# Patient Record
Sex: Female | Born: 1975 | Race: Asian | Hispanic: No | Marital: Married | Smoking: Never smoker
Health system: Southern US, Community
[De-identification: ages and names within clinical notes are randomized; demographics above are authoritative.]

## PROBLEM LIST (undated history)

## (undated) DIAGNOSIS — N72 Inflammatory disease of cervix uteri: Secondary | ICD-10-CM

## (undated) DIAGNOSIS — J45909 Unspecified asthma, uncomplicated: Secondary | ICD-10-CM

## (undated) HISTORY — DX: Inflammatory disease of cervix uteri: N72

## (undated) HISTORY — PX: BREAST BIOPSY: SHX20

## (undated) HISTORY — PX: APPENDECTOMY: SHX54

---

## 2017-01-03 ENCOUNTER — Other Ambulatory Visit: Payer: Self-pay | Admitting: Nurse Practitioner

## 2017-01-03 DIAGNOSIS — R1010 Upper abdominal pain, unspecified: Secondary | ICD-10-CM

## 2017-01-09 ENCOUNTER — Ambulatory Visit
Admission: RE | Admit: 2017-01-09 | Discharge: 2017-01-09 | Disposition: A | Discharge status: Home / Self Care | Payer: BLUE CROSS/BLUE SHIELD | Source: Ambulatory Visit | Attending: Nurse Practitioner | Admitting: Nurse Practitioner

## 2017-01-09 DIAGNOSIS — R1010 Upper abdominal pain, unspecified: Secondary | ICD-10-CM | POA: Insufficient documentation

## 2017-04-17 ENCOUNTER — Other Ambulatory Visit: Payer: Self-pay | Admitting: Nurse Practitioner

## 2017-04-17 DIAGNOSIS — Z1231 Encounter for screening mammogram for malignant neoplasm of breast: Secondary | ICD-10-CM

## 2017-05-21 ENCOUNTER — Ambulatory Visit
Admission: RE | Admit: 2017-05-21 | Discharge: 2017-05-21 | Disposition: A | Discharge status: Home / Self Care | Payer: BLUE CROSS/BLUE SHIELD | Source: Ambulatory Visit | Attending: Nurse Practitioner | Admitting: Nurse Practitioner

## 2017-05-21 ENCOUNTER — Encounter (HOSPITAL_COMMUNITY): Payer: Self-pay

## 2017-05-21 DIAGNOSIS — Z1231 Encounter for screening mammogram for malignant neoplasm of breast: Secondary | ICD-10-CM | POA: Diagnosis not present

## 2017-06-14 ENCOUNTER — Telehealth: Payer: Self-pay | Admitting: Obstetrics & Gynecology

## 2017-06-14 NOTE — Telephone Encounter (Signed)
[facility] referring for Birth control option, possibly IUD. lvm for pt to cb

## 2017-06-18 NOTE — Telephone Encounter (Signed)
Lvm for patient to call back to be schedule °

## 2017-07-03 ENCOUNTER — Ambulatory Visit (INDEPENDENT_AMBULATORY_CARE_PROVIDER_SITE_OTHER): Payer: BLUE CROSS/BLUE SHIELD | Admitting: Obstetrics and Gynecology

## 2017-07-03 ENCOUNTER — Encounter: Payer: Self-pay | Admitting: Obstetrics and Gynecology

## 2017-07-03 VITALS — BP 110/78 | HR 95 | Ht 64.0 in | Wt 145.0 lb

## 2017-07-03 DIAGNOSIS — Z124 Encounter for screening for malignant neoplasm of cervix: Secondary | ICD-10-CM

## 2017-07-03 DIAGNOSIS — L811 Chloasma: Secondary | ICD-10-CM

## 2017-07-03 DIAGNOSIS — Z30014 Encounter for initial prescription of intrauterine contraceptive device: Secondary | ICD-10-CM

## 2017-07-03 DIAGNOSIS — N72 Inflammatory disease of cervix uteri: Secondary | ICD-10-CM | POA: Diagnosis not present

## 2017-07-03 DIAGNOSIS — Z1151 Encounter for screening for human papillomavirus (HPV): Secondary | ICD-10-CM

## 2017-07-03 DIAGNOSIS — Z01419 Encounter for gynecological examination (general) (routine) without abnormal findings: Secondary | ICD-10-CM

## 2017-07-03 NOTE — Progress Notes (Signed)
PCP:  Fayrene Helper, NP   Chief Complaint  Patient presents with  . Gynecologic Exam  . Contraception    referred by PCP for IUD   ENCOUNTER DONE WITH [place] INTERPRETER ON LANGUAGE LINE--"Kristin Shepherd", # [id number].   HPI:      Ms. Kristin Shepherd is a 42 y.o. [phone] who LMP was No LMP recorded. Patient is not currently having periods (Reason: Perimenopausal)., presents today for her NP annual examination.  Her menses are regular every 28-30 days, lasting 7 days.  Dysmenorrhea mild, occurring first 1-2 days of flow. She does not have intermenstrual bleeding.  Sex activity: single partner, contraception - OCP (estrogen/progesterone) "Mercilon". Pt got it in [place], but it's a low dose desogestrel/EE pill. Pt having issues with melasma on cheeks and chin and wants IUD again. Interested in [place].  Last Pap: ~6 yrs ago. 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 Was told to have cx check every 6 months. Had IUD removed a couple yrs ago due to this. Been on OCPs since.  Last mammogram: May 21, 2017  Results were: normal--routine follow-up in 12 months, with PCP. There is no FH of breast cancer. There is no FH of ovarian cancer.    Past Medical History:  Diagnosis Date  . Cervical inflammation     Past Surgical History:  Procedure Laterality Date  . BREAST BIOPSY Left    neg    Family History  Problem Relation Age of Onset  . Breast cancer Neg Hx     Social History   Socioeconomic History  . Marital status: Married    Spouse name: Not on file  . Number of children: Not on file  . Years of education: Not on file  . Highest education level: Not on file  Social Needs  . Financial resource strain: Not on file  . Food insecurity - worry: Not on file  . Food insecurity - inability: Not on file  . Transportation needs - medical: Not on file  . Transportation needs - non-medical: Not on file  Occupational History  . Not on file  Tobacco Use   . Smoking status: Never Smoker  . Smokeless tobacco: Never Used  Substance and Sexual Activity  . Alcohol use: No    Frequency: Never  . Drug use: No  . Sexual activity: Yes    Birth control/protection: Pill  Other Topics Concern  . Not on file  Social History Narrative  . Not on file    Current Meds  Medication Sig  . ADVAIR DISKUS 250-50 MCG/DOSE AEPB   . azithromycin (ZITHROMAX) 500 MG tablet take 1 tablet by mouth once daily for 7 days with food  . desogestrel-ethinyl estradiol (KARIVA,AZURETTE,MIRCETTE) 0.15-0.02/0.01 MG (21/5) tablet Take 1 tablet by mouth daily.  . fluticasone (FLONASE) 50 MCG/ACT nasal spray instill 1 spray into each nostril daily as directed for 2 weeks  . montelukast (SINGULAIR) 10 MG tablet Take 10 mg by mouth every morning.  Marland Kitchen omeprazole (PRILOSEC) 40 MG capsule take 1 capsule by mouth every morning 1 HOUR before breakfast for ACID REFLUX  . predniSONE (DELTASONE) 20 MG tablet take 2 tablets by mouth every morning with food for 5 days  . RA ALLERGY RELIEF 180 MG tablet take 1 tablet by mouth every morning for POST NASAL DRIP for 2 weeks     ROS:  Review of Systems  Constitutional: Negative for fatigue, fever and unexpected weight change.  Respiratory: Negative  for cough, shortness of breath and wheezing.   Cardiovascular: Negative for chest pain, palpitations and leg swelling.  Gastrointestinal: Negative for blood in stool, constipation, diarrhea, nausea and vomiting.  Endocrine: Negative for cold intolerance, heat intolerance and polyuria.  Genitourinary: Negative for dyspareunia, dysuria, flank pain, frequency, genital sores, hematuria, menstrual problem, pelvic pain, urgency, vaginal bleeding, vaginal discharge and vaginal pain.  Musculoskeletal: Negative for back pain, joint swelling and myalgias.  Skin: Negative for rash.  Neurological: Negative for dizziness, syncope, light-headedness, numbness and headaches.  Hematological: Negative for  adenopathy.  Psychiatric/Behavioral: Negative for agitation, confusion, sleep disturbance and suicidal ideas. The patient is not nervous/anxious.      Objective: BP 110/78   Pulse 95    Physical Exam  Constitutional: She is oriented to person, place, and time. She appears well-developed and well-nourished.  Genitourinary: Vagina normal and uterus normal. There is no rash or tenderness on the right labia. There is no rash or tenderness on the left labia. No erythema or tenderness in the vagina. No vaginal discharge found. Right adnexum does not display mass and does not display tenderness. Left adnexum does not display mass and does not display tenderness. Cervix does not exhibit motion tenderness or polyp. Uterus is not enlarged or tender.  Neck: Normal range of motion. No thyromegaly present.  Cardiovascular: Normal rate, regular rhythm and normal heart sounds.  No murmur heard. Pulmonary/Chest: Effort normal and breath sounds normal. Right breast exhibits no mass, no nipple discharge, no skin change and no tenderness. Left breast exhibits no mass, no nipple discharge, no skin change and no tenderness.  Abdominal: Soft. There is no tenderness. There is no guarding.  Musculoskeletal: Normal range of motion.  Neurological: She is alert and oriented to person, place, and time. No cranial nerve deficit.  Skin:  BROWN MELASMA PATCHES ON CHEEKS AND CHIN  Psychiatric: She has a normal mood and affect. Her behavior is normal.  Vitals reviewed.   Assessment/Plan: Encounter for annual routine gynecological examination  Cervical cancer screening - Plan: IGP, Aptima HPV  Screening for HPV (human papillomavirus) - Plan: IGP, Aptima HPV  Cervical inflammation - Check pap smear. Will call with results.   Encounter for initial prescription of intrauterine contraceptive device (IUD) - IUD discussed. Mirena handout given. Pt to RTO wtih menses for insertion. NSAIDs. Either bring niece as  interpreter or can do language line.   Melasma - Should resolve once off OCPs.    GYN counsel use and side effects of OCP's, family planning choices     F/U  Return in about 1 week (around 07/10/2017) for with menses for IUD insertion.  Alicia B. Copland, PA-C 07/03/2017 4:05 PM

## 2017-07-03 NOTE — Patient Instructions (Signed)
I value your feedback and entrusting us with your care. If you get a [facility] patient survey, I would appreciate you taking the time to let us know about your experience today. Thank you! 

## 2017-07-05 LAB — IGP, APTIMA HPV
HPV Aptima: NEGATIVE
PAP SMEAR COMMENT: 0

## 2017-07-15 ENCOUNTER — Telehealth: Payer: Self-pay | Admitting: Obstetrics and Gynecology

## 2017-07-15 NOTE — Telephone Encounter (Signed)
Pt is schedule 07/16/17 for Mirena insertion. Pt requested interupter

## 2017-07-16 ENCOUNTER — Encounter: Payer: Self-pay | Admitting: Obstetrics and Gynecology

## 2017-07-16 ENCOUNTER — Ambulatory Visit: Payer: BLUE CROSS/BLUE SHIELD | Admitting: Obstetrics and Gynecology

## 2017-07-16 VITALS — BP 118/78 | Ht 64.0 in | Wt 147.0 lb

## 2017-07-16 DIAGNOSIS — Z30430 Encounter for insertion of intrauterine contraceptive device: Secondary | ICD-10-CM

## 2017-07-16 MED ORDER — LEVONORGESTREL 20 MCG/24HR IU IUD: 1.0000 | INTRAUTERINE_SYSTEM | Freq: Once | INTRAUTERINE | 0 refills | Status: DC

## 2017-07-16 NOTE — Progress Notes (Signed)
   Chief Complaint  Patient presents with  . Contraception     IUD PROCEDURE NOTE:  Kristin Shepherd is a 42 y.o. [id number] here for Mirena  IUD insertion for contraception. She had melasma with OCPs. Referred by PCP for IUD insertion.  Pap 2/19 neg/Neg HPV DNA.   BP 118/78   Ht 5\' 4"  (1.626 m)   Wt 147 lb (66.7 kg)   BMI 25.23 kg/m   IUD Insertion Procedure Note Patient identified, informed consent performed, consent signed.   Discussed risks of irregular bleeding, cramping, infection, malpositioning or misplacement of the IUD outside the uterus which may require further procedure such as laparoscopy, risk of failure <1%. Time out was performed.    Speculum placed in the vagina.  Cervix visualized.  Cleaned with Betadine x 2.  Grasped anteriorly with a single tooth tenaculum.  Uterus sounded to 7.0 cm.   IUD placed per manufacturer's recommendations.  Strings trimmed to 3 cm. Tenaculum was removed, good hemostasis noted.  Patient tolerated procedure well.   ASSESSMENT:  Encounter for insertion of intrauterine contraceptive device (IUD) - Plan: levonorgestrel (MIRENA, 52 MG,) 20 MCG/24HR IUD   Meds ordered this encounter  Medications  . levonorgestrel (MIRENA, 52 MG,) 20 MCG/24HR IUD    Sig: 1 Intra Uterine Device (1 each total) by Intrauterine route once for 1 dose.    Dispense:  1 Intra Uterine Device    Refill:  0    Order Specific Question:   Supervising Provider    Answer:   Nadara MustardHARRIS, ROBERT P [[id number]]   Plan:  Patient was given post-procedure instructions.  She was advised to have backup contraception for one week.   Call if you are having increasing pain, cramps or bleeding or if you have a fever greater than 100.4 degrees F., shaking chills, nausea or vomiting. Patient was also asked to check IUD strings periodically and follow up in 4 weeks for IUD check.  Return in about 4 weeks (around 08/13/2017) for IUD f/u.  Atticus Lemberger B. Armeda Plumb, PA-C 07/16/2017 2:34 PM

## 2017-07-16 NOTE — Patient Instructions (Addendum)
I value your feedback and entrusting us with your care. If you get a [facility] patient survey, I would appreciate you taking the time to let us know about your experience today. Thank you!  [facility] [phone]  Instructions after IUD insertion  Most women experience no significant problems after insertion of an IUD, however minor cramping and spotting for a few days is common. Cramps may be treated with ibuprofen 800mg every 8 hours or Tylenol 650 mg every 4 hours. Contact [facility] immediately if you experience any of the following symptoms during the next week: temperature >99.6 degrees, worsening pelvic pain, abdominal pain, fainting, unusually heavy vaginal bleeding, foul vaginal discharge, or if you think you have expelled the IUD.  Nothing inserted in the vagina for 48 hours. You will be scheduled for a follow up visit in approximately four weeks.  You should check monthly to be sure you can feel the IUD strings in the upper vagina. If you are having a monthly period, try to check after each period. If you cannot feel the IUD strings,  contact [facility] immediately so we can do an exam to determine if the IUD has been expelled.   Please use backup protection until we can confirm the IUD is in place.  Call [facility] if you are exposed to or diagnosed with a sexually transmitted infection, as we will need to discuss whether it is safe for you to continue using an IUD.   

## 2017-07-17 NOTE — Telephone Encounter (Signed)
Mirena received 07/16/17

## 2017-08-14 ENCOUNTER — Ambulatory Visit: Payer: BLUE CROSS/BLUE SHIELD | Admitting: Obstetrics and Gynecology

## 2017-08-14 ENCOUNTER — Encounter: Payer: Self-pay | Admitting: Obstetrics and Gynecology

## 2017-08-14 VITALS — BP 110/68 | HR 75 | Ht 62.0 in | Wt 148.0 lb

## 2017-08-14 DIAGNOSIS — Z30431 Encounter for routine checking of intrauterine contraceptive device: Secondary | ICD-10-CM | POA: Diagnosis not present

## 2017-08-14 NOTE — Progress Notes (Signed)
   Chief Complaint  Patient presents with  . Follow-up    IUD check      History of Present Illness:  Kristin Shepherd is a 42 y.o. that had a Mirena IUD placed approximately 1 month ago. Since that time, she denies dyspareunia, pelvic pain, non-menstrual bleeding, vaginal d/c, heavy bleeding. Melasma is improving, which is one reason pt wanted off OCPs. Doing well.   Review of Systems  Constitutional: Negative for fever.  Gastrointestinal: Negative for blood in stool, constipation, diarrhea, nausea and vomiting.  Genitourinary: Negative for dyspareunia, dysuria, flank pain, frequency, hematuria, urgency, vaginal bleeding, vaginal discharge and vaginal pain.  Musculoskeletal: Negative for back pain.  Skin: Negative for rash.    Physical Exam:  BP 110/68   Pulse 75   Ht 5\' 2"  (1.575 m)   Wt 148 lb (67.1 kg)   BMI 27.07 kg/m  Body mass index is 27.07 kg/m.  Pelvic exam:  Two IUD strings present seen coming from the cervical os. EGBUS, vaginal vault and cervix: within normal limits   Assessment:   Encounter for routine checking of intrauterine contraceptive device (IUD)  IUD strings present in proper location; pt doing well  Plan: F/u if any signs of infection or can no longer feel the strings.   Kristin Doten B. Jewell Ryans, PA-C 08/14/2017 10:03 AM

## 2017-08-14 NOTE — Patient Instructions (Signed)
I value your feedback and entrusting us with your care. If you get a [facility] patient survey, I would appreciate you taking the time to let us know about your experience today. Thank you! 

## 2018-01-22 ENCOUNTER — Other Ambulatory Visit: Payer: Self-pay | Admitting: Nurse Practitioner

## 2018-01-22 DIAGNOSIS — Z1231 Encounter for screening mammogram for malignant neoplasm of breast: Secondary | ICD-10-CM

## 2018-05-23 ENCOUNTER — Ambulatory Visit
Admission: RE | Admit: 2018-05-23 | Discharge: 2018-05-23 | Disposition: A | Discharge status: Home / Self Care | Payer: BLUE CROSS/BLUE SHIELD | Source: Ambulatory Visit | Attending: Nurse Practitioner | Admitting: Nurse Practitioner

## 2018-05-23 DIAGNOSIS — Z1231 Encounter for screening mammogram for malignant neoplasm of breast: Secondary | ICD-10-CM | POA: Diagnosis present

## 2018-05-28 ENCOUNTER — Other Ambulatory Visit: Payer: Self-pay | Admitting: Nurse Practitioner

## 2018-05-28 DIAGNOSIS — R928 Other abnormal and inconclusive findings on diagnostic imaging of breast: Secondary | ICD-10-CM

## 2018-05-28 DIAGNOSIS — N6489 Other specified disorders of breast: Secondary | ICD-10-CM

## 2019-03-11 ENCOUNTER — Other Ambulatory Visit: Payer: Self-pay | Admitting: Nurse Practitioner

## 2019-03-11 DIAGNOSIS — N6489 Other specified disorders of breast: Secondary | ICD-10-CM

## 2019-05-22 ENCOUNTER — Encounter: Payer: Self-pay | Admitting: Emergency Medicine

## 2019-05-22 ENCOUNTER — Emergency Department: Payer: 59

## 2019-05-22 ENCOUNTER — Emergency Department
Admission: EM | Admit: 2019-05-22 | Discharge: 2019-05-22 | Disposition: A | Discharge status: Home / Self Care | Payer: 59 | Attending: Student | Admitting: Student

## 2019-05-22 ENCOUNTER — Other Ambulatory Visit: Payer: Self-pay

## 2019-05-22 DIAGNOSIS — J45909 Unspecified asthma, uncomplicated: Secondary | ICD-10-CM | POA: Diagnosis not present

## 2019-05-22 DIAGNOSIS — R0789 Other chest pain: Secondary | ICD-10-CM | POA: Diagnosis not present

## 2019-05-22 DIAGNOSIS — Z79899 Other long term (current) drug therapy: Secondary | ICD-10-CM | POA: Diagnosis not present

## 2019-05-22 DIAGNOSIS — R109 Unspecified abdominal pain: Secondary | ICD-10-CM | POA: Diagnosis not present

## 2019-05-22 HISTORY — DX: Unspecified asthma, uncomplicated: J45.909

## 2019-05-22 LAB — COMPREHENSIVE METABOLIC PANEL
ALT: 26 U/L (ref 0–44)
AST: 25 U/L (ref 15–41)
Albumin: 4.2 g/dL (ref 3.5–5.0)
Alkaline Phosphatase: 68 U/L (ref 38–126)
Anion gap: 9 (ref 5–15)
BUN: 15 mg/dL (ref 6–20)
CO2: 23 mmol/L (ref 22–32)
Calcium: 9.3 mg/dL (ref 8.9–10.3)
Chloride: 108 mmol/L (ref 98–111)
Creatinine, Ser: 0.66 mg/dL (ref 0.44–1.00)
GFR calc Af Amer: >60 mL/min (ref >60)
GFR calc non Af Amer: >60 mL/min (ref >60)
Glucose, Bld: 135 mg/dL — ABNORMAL HIGH (ref 70–99)
Potassium: 3.6 mmol/L (ref 3.5–5.1)
Sodium: 140 mmol/L (ref 135–145)
Total Bilirubin: 0.6 mg/dL (ref 0.3–1.2)
Total Protein: 7.5 g/dL (ref 6.5–8.1)

## 2019-05-22 LAB — CBC WITH DIFFERENTIAL/PLATELET
Abs Immature Granulocytes: 0.03 10*3/uL (ref 0.00–0.07)
Basophils Absolute: 0 10*3/uL (ref 0.0–0.1)
Basophils Relative: 0 %
Eosinophils Absolute: 0.1 10*3/uL (ref 0.0–0.5)
Eosinophils Relative: 1 %
HCT: 37.9 % (ref 36.0–46.0)
Hemoglobin: 13.3 g/dL (ref 12.0–15.0)
Immature Granulocytes: 0 %
Lymphocytes Relative: 14 %
Lymphs Abs: 1.2 10*3/uL (ref 0.7–4.0)
MCH: 29.7 pg (ref 26.0–34.0)
MCHC: 35.1 g/dL (ref 30.0–36.0)
MCV: 84.6 fL (ref 80.0–100.0)
Monocytes Absolute: 0.7 10*3/uL (ref 0.1–1.0)
Monocytes Relative: 8 %
Neutro Abs: 7 10*3/uL (ref 1.7–7.7)
Neutrophils Relative %: 77 %
Platelets: 254 10*3/uL (ref 150–400)
RBC: 4.48 MIL/uL (ref 3.87–5.11)
RDW: 12.4 % (ref 11.5–15.5)
WBC: 9.1 10*3/uL (ref 4.0–10.5)
nRBC: 0 % (ref 0.0–0.2)

## 2019-05-22 LAB — TROPONIN I (HIGH SENSITIVITY)
Troponin I (High Sensitivity): <2 ng/L (ref <18)
Troponin I (High Sensitivity): <2 ng/L (ref <18)

## 2019-05-22 LAB — LIPASE, BLOOD: Lipase: 43 U/L (ref 11–51)

## 2019-05-22 MED ORDER — DICYCLOMINE HCL 10 MG PO CAPS: 10.0000 mg | ORAL_CAPSULE | Freq: Three times a day (TID) | ORAL | 0 refills | Status: DC | PRN

## 2019-05-22 MED ORDER — DICYCLOMINE HCL 10 MG PO CAPS
10.0000 mg | ORAL_CAPSULE | Freq: Once | ORAL | Status: AC
  Administered 2019-05-22: 10:00:00 10 mg via ORAL
  Filled 2019-05-22: qty 1

## 2019-05-22 MED ORDER — ASPIRIN 81 MG PO CHEW
324.0000 mg | CHEWABLE_TABLET | Freq: Once | ORAL | Status: AC
  Administered 2019-05-22: 324 mg via ORAL
  Filled 2019-05-22: qty 4

## 2019-05-22 NOTE — ED Provider Notes (Signed)
[facility] Emergency Department Provider Note  ____________________________________________   First MD Initiated Contact with Patient 05/22/19 0912     (approximate)  I have reviewed the triage vital signs and the nursing notes.  History  Chief Complaint Abdominal Pain    HPI Kristin Shepherd is a 44 y.o. female with history of asthma, who presents emergency department for multiple complaints.  First, she states she woke up this morning around 2 AM and had diffuse abdominal pain.  Did not localize to one specific spot.  Described as a cramping-like sensation, moderate in severity, no radiation.  She had a loose bowel movement and her pain was alleviated.  During her period of discomfort, she did have sweating and some shortness of breath.  These have improved since moving her bowels.  She is not currently feeling any abdominal pain, but states she has recurrence of symptoms when she feels urge to have a bowel movement.  Second, she states that since waiting in the emergency department she developed some vague, left-sided chest pain.  Difficult to describe.  No radiation.  No alleviating or aggravating factors.  No associated nausea.  Her shortness of breath has improved since having her bowel movement, as noted above.  Does not smoke tobacco.  No history of heart disease.  Recently diagnosed with COVID 3 weeks ago, but recovered at home without difficulty.  Did not require hospitalization.  History obtained with the assistance of a [place] interpreter.   Past Medical Hx Past Medical History:  Diagnosis Date  . Asthma   . Cervical inflammation     Problem List Patient Active Problem List   Diagnosis Date Noted  . Cervical inflammation 07/03/2017  . Melasma 07/03/2017    Past Surgical Hx Past Surgical History:  Procedure Laterality Date  . APPENDECTOMY    . BREAST BIOPSY Left    neg    Medications Prior to Admission medications   Medication Sig  Start Date End Date Taking? Authorizing Provider  ADVAIR DISKUS 250-50 MCG/DOSE AEPB  06/24/17   [provider]  azithromycin (ZITHROMAX) 500 MG tablet take 1 tablet by mouth once daily for 7 days with food 06/03/17   [provider]  fluticasone (FLONASE) 50 MCG/ACT nasal spray instill 1 spray into each nostril daily as directed for 2 weeks 06/03/17   [provider]  levonorgestrel (MIRENA, 52 MG,) 20 MCG/24HR IUD 1 Intra Uterine Device (1 each total) by Intrauterine route once for 1 dose. 07/16/17 07/16/17  Copland, Ilona Sorrel, PA-C  montelukast (SINGULAIR) 10 MG tablet Take 10 mg by mouth every morning. 04/17/17   [provider]  omeprazole (PRILOSEC) 40 MG capsule take 1 capsule by mouth every morning 1 HOUR before breakfast for ACID REFLUX 06/03/17   [provider]  predniSONE (DELTASONE) 20 MG tablet take 2 tablets by mouth every morning with food for 5 days 06/03/17   [provider]  RA ALLERGY RELIEF 180 MG tablet take 1 tablet by mouth every morning for POST NASAL DRIP for 2 weeks 06/03/17   [provider]    Allergies Patient has no known allergies.  Family Hx Family History  Problem Relation Age of Onset  . Breast cancer Neg Hx     Social Hx Social History   Tobacco Use  . Smoking status: Never Smoker  . Smokeless tobacco: Never Used  Substance Use Topics  . Alcohol use: No  . Drug use: No     Review of  Systems  Constitutional: Negative for fever, chills. Eyes: Negative for visual changes. ENT: Negative for sore throat. Cardiovascular: + for chest pain. Respiratory: + for shortness of breath. Gastrointestinal: + abdominal pain Genitourinary: Negative for dysuria. Musculoskeletal: Negative for leg swelling. Skin: Negative for rash. Neurological: Negative for for headaches.   Physical Exam  Vital Signs: ED Triage Vitals  Enc Vitals Group     BP 05/22/19 0458 126/81     Pulse Rate 05/22/19 0458 92       Resp 05/22/19 0458 20     Temp 05/22/19 0458 98 F (36.7 C)     Temp Source 05/22/19 0458 Oral     SpO2 05/22/19 0458 98 %     Weight 05/22/19 0458 141 lb 1.5 oz (64 kg)     Height 05/22/19 0458 5\' 4"  (1.626 m)     Head Circumference --      Peak Flow --      Pain Score 05/22/19 0456 0     Pain Loc --      Pain Edu? --      Excl. in [place]? --     Constitutional: Alert and oriented.  Head: Normocephalic. Atraumatic. Eyes: Conjunctivae clear. Sclera anicteric. Nose: No congestion. No rhinorrhea. Mouth/Throat: Wearing mask.  Neck: No stridor.   Cardiovascular: Normal rate, regular rhythm. Extremities well perfused. Respiratory: Normal respiratory effort.  Lungs CTAB. Gastrointestinal: Soft. Non-tender throughout to deep palpation. Non-distended.  Musculoskeletal: No lower extremity edema. No deformities. Neurologic:  Normal speech and language. No gross focal neurologic deficits are appreciated.  Skin: Skin is warm, dry and intact. No rash noted. Psychiatric: Mood and affect are appropriate for situation.  EKG  Personally reviewed.   Rate: 79 Rhythm: sinus Axis: normal Intervals: WNL No acute ischemic changes No STEMI    Radiology  CXR: IMPRESSION:  No active disease.    Procedures  Procedure(s) performed (including critical care):  Procedures   Initial Impression / Assessment and Plan / ED Course  44 y.o. female who presents to the ED for abdominal cramping, and a separate episode of chest pain while waiting in the emergency department.  As above.  DDx: abdominal cramping, viral GI process, colitis, IBS. Abdominal exam is very reassuring, abdomen is soft, completely nontender throughout.  She denies any pain at present.  CMP, lipase without actionable derangements, do not feel emergent imaging is indicated at this time.  Will give Bentyl for symptomatic support.  DDx: atypical ACS, post viral pleurisy/pericarditis, GERD. PERC negative, doubt PE - no  tachycardia, tachypnea, or hypoxia. Will obtain troponin x 2, EKG CXR.  ASA.  Troponin x 2 negative. EKG without acute ischemic changes.  XR negative.  Remainder of labs without actionable derangements.  Given negative work-up, patient stable for discharge with outpatient follow-up.  Given Rx for Bentyl as needed for symptomatic support.  Given return precautions.   Final Clinical Impression(s) / ED Diagnosis  Final diagnoses:  Abdominal cramping  Chest discomfort       Note:  This document was prepared using Dragon voice recognition software and may include unintentional dictation errors.   Lilia Pro., MD 05/22/19 1100

## 2019-05-22 NOTE — ED Notes (Signed)
[employer] interpretor used [id number].  [place]

## 2019-05-22 NOTE — Discharge Instructions (Signed)
Thank you for letting us take care of you in the emergency department today.   Please continue to take any regular, prescribed medications.   New medications we have prescribed:  - Bentyl - take as needed for abdominal cramping.  Please follow up with: - Your primary care doctor to review your ER visit and follow up on your symptoms.   Please return to the ER for any new or worsening symptoms.

## 2019-05-22 NOTE — ED Triage Notes (Signed)
Pt to ED lobby via w/c with no distress noted, mask in place; per [employer] video interpreter, pt reports having mid lower abd pain that increased after diarrheal stool; denies N/V; denies pain now; denies constipation or urinary c/o

## 2021-01-04 ENCOUNTER — Other Ambulatory Visit: Payer: Self-pay | Admitting: Nurse Practitioner

## 2021-01-04 DIAGNOSIS — N6489 Other specified disorders of breast: Secondary | ICD-10-CM

## 2021-01-20 ENCOUNTER — Other Ambulatory Visit: Payer: Self-pay

## 2021-01-20 ENCOUNTER — Ambulatory Visit
Admission: RE | Admit: 2021-01-20 | Discharge: 2021-01-20 | Disposition: A | Discharge status: Home / Self Care | Payer: 59 | Source: Ambulatory Visit | Attending: Nurse Practitioner | Admitting: Nurse Practitioner

## 2021-01-20 DIAGNOSIS — N6489 Other specified disorders of breast: Secondary | ICD-10-CM | POA: Insufficient documentation

## 2021-01-24 ENCOUNTER — Other Ambulatory Visit: Payer: Self-pay | Admitting: Nurse Practitioner

## 2021-01-24 DIAGNOSIS — N6489 Other specified disorders of breast: Secondary | ICD-10-CM

## 2022-04-10 ENCOUNTER — Other Ambulatory Visit (HOSPITAL_COMMUNITY)
Admission: RE | Admit: 2022-04-10 | Discharge: 2022-04-10 | Disposition: A | Discharge status: Home / Self Care | Payer: Commercial Managed Care - HMO | Source: Ambulatory Visit | Attending: Advanced Practice Midwife | Admitting: Advanced Practice Midwife

## 2022-04-10 ENCOUNTER — Encounter: Payer: Self-pay | Admitting: Advanced Practice Midwife

## 2022-04-10 ENCOUNTER — Ambulatory Visit (INDEPENDENT_AMBULATORY_CARE_PROVIDER_SITE_OTHER): Payer: Commercial Managed Care - HMO | Admitting: Advanced Practice Midwife

## 2022-04-10 VITALS — BP 140/86 | HR 88 | Ht 64.0 in | Wt 158.0 lb

## 2022-04-10 DIAGNOSIS — Z30011 Encounter for initial prescription of contraceptive pills: Secondary | ICD-10-CM | POA: Diagnosis not present

## 2022-04-10 DIAGNOSIS — Z124 Encounter for screening for malignant neoplasm of cervix: Secondary | ICD-10-CM

## 2022-04-10 DIAGNOSIS — Z30432 Encounter for removal of intrauterine contraceptive device: Secondary | ICD-10-CM | POA: Diagnosis not present

## 2022-04-10 MED ORDER — DESOGESTREL-ETHINYL ESTRADIOL 0.15-0.02/0.01 MG (21/5) PO TABS: 1.0000 | ORAL_TABLET | Freq: Every day | ORAL | 11 refills | Status: DC

## 2022-04-11 ENCOUNTER — Encounter: Payer: Self-pay | Admitting: Advanced Practice Midwife

## 2022-04-11 ENCOUNTER — Other Ambulatory Visit: Payer: Self-pay | Admitting: Advanced Practice Midwife

## 2022-04-11 NOTE — Patient Instructions (Signed)
Cc bi?n php trnh thai Contraception Choices Trnh thai, hay cn g?i l ki?m sot vi?c c thai, ?? c?p ??n cc bi?n php ho?c d?ng c? ng?n ng?a c thai. Ph??ng php hc mn  Que c?y thu?c trnh thai Que c?y trnh thai l m?t ?ng nh?a, m?ng ch?a hc mn phng ng?a mang thai. N khc v?i vng trnh thai trong t? cung (IUD). ?ng ???c chuyn gia ch?m sc s?c kho? lu?n vo ph?n trn c?a cnh tay. Que c?y c th? c hi?u qu? ln ??n 3 n?m. Tim thu?c trnh thai ch? c progestin Tim thu?c trnh thai ch? c progestin l progestin d?ng tim, m?t d?ng t?ng h?p c?a hc mn progesterone. Thu?c ???c chuyn gia ch?m sc s?c kh?e tim 3 thng m?t l?n. Thu?c vin trnh thai Thu?c vin trnh thai l vin thu?c c hc mn ng?n ng?a c thai. Ph?i u?ng thu?c m?i ngy m?t l?n, t?t nh?t l vo cng m?t th?i ?i?m m?i ngy. C?n m?t ??n thu?c ?? s? d?ng ph??ng php trnh thai ny. Mi?ng dn trnh thai Mi?ng dn trnh thai c hc mn ng?n ng?a c thai. Mi?ng dn ny ???c dn ln da v ph?i thay m?i tu?n m?t l?n trong ba tu?n v ???c l?y b? vo tu?n th? t?. C?n m?t ??n thu?c ?? s? d?ng ph??ng php trnh thai ny. Vng ??t m ??o Vng ??t m ??o c hc mn ng?n ng?a c thai. Vng ???c ??t vo trong m ??o trong ba tu?n v ???c l?y b? vo tu?n th? t?. Sau ?, qu trnh ???c l?p l?i v?i m?t vng m?i. C?n m?t ??n thu?c ?? s? d?ng ph??ng php trnh thai ny. Thu?c trnh thai kh?n c?p Thu?c trnh thai kh?n c?p ng?n ng?a mang thai sau khi quan h? tnh d?c khng c bi?n php b?o v?. Thu?c ? d?ng vin nn v c th? ???c dng t?i ?a 5 ngy sau khi quan h? tnh d?c. Cng u?ng thu?c s?m sau quan h? tnh d?c bao nhiu th thu?c cng c tc d?ng t?t b?y nhiu. H?u h?t cc thu?c trnh thai kh?n c?p ??u c bn m khng c k ??n. Khng ???c dng ph??ng php ny lm hnh th?c trnh thai duy nh?t c?a qu v?. Ph??ng php mng ng?n  Bao cao su dnh cho nam gi?i Bao cao su dnh cho nam gi?i l m?t bao m?ng ?eo trm ln d??ng v?t lc quan h?  tnh d?c. Bao cao su ng?n tinh d?ch ?i vo trong c? th? ng??i ph? n?. Bao cao su c th? ???c s? d?ng cng v?i ch?t di?t tinh trng (ch?t di?t tinh trng) ?? t?ng hi?u qu?. Ph?i v?t b? sau m?t l?n s? d?ng. Bao cao su dnh cho n? gi?i Bao cao su dnh cho n? gi?i l m?t lo?i bao m?m, r?ng ???c ??a vo m ??o tr??c khi quan h? tnh d?c. Bao cao su ny ng?n tinh d?ch ?i vo trong c? th? ng??i ph? n?. Ph?i v?t b? sau m?t l?n s? d?ng. Mng ch?n Mng ch?n l m?t mng m?m, hnh vm. Mng ny ???c ??t vo trong m ??o tr??c khi quan h? tnh d?c, cng v?i ch?t di?t tinh trng. Mng ch?n ng?n tinh trng xm nh?p t? cung v ch?t di?t tinh trng s? tiu di?t tinh trng. C?n ph?i ?? l?i mng ch?n trong m ??o trong 6-8 gi? sau khi quan h? tnh d?c v tho ra trong vng 24 gi?. Mng ch?n ph?i ???c k ??n v do chuyn   gia ch?m sc s?c kh?e ??t. C?n ph?i thay mng ch?n 1-2 n?m m?t l?n, sau khi sinh, sau khi t?ng trn 15 lb (6,8 kg) v sau khi ph?u thu?t vng ch?u. M? c? t? cung M? c? t? cung l m?t c?c nh?a ho?c cao su m?m, trn bao kht c? t? cung. M? ???c ??t vo trong m ??o tr??c khi quan h? tnh d?c, cng v?i ch?t di?t tinh trng. M? ny ng?n tinh trng thm nh?p t? cung. M? c? t? cung ph?i ???c ?? nguyn trong 6-8 gi? sau khi quan h? tnh d?c v tho ra trong vng 48 gi?. M? c? t? cung ph?i ???c k ??n v do chuyn gia ch?m sc s?c kh?e ??t. M? ph?i ???c thay 2 n?m m?t l?n. B?t bi?n trnh thai B?t bi?n trnh thai l mi?ng b?t polyurethane hnh trn, m?m ch?a ch?t di?t tinh trng. B?t bi?n trnh thai gip ng?n tinh trng thm nh?p t? cung v ch?t di?t tinh trng s? tiu di?t tinh trng. ?? s? d?ng, hy lm ?m mi?ng b?t bi?n trnh thai ? v sau ? ??a vo m ??o. Ph?i ??a vo m ??o tr??c khi quan h?, ?? l?i t?i Janal?u 6 gi? sau khi quan h? tnh d?c v tho ra r?i v?t ?i trong vng 30 gi?. Ch?t di?t tinh trng Ch?t di?t tinh trng l nh?ng ha ch?t di?t ho?c ng?n khng cho tinh trng xm nh?p vo c? t? cung v t? cung.  Ch?t ny c th? ? d?ng kem, d?ng ?ng ??c, thu?c ??n, d?ng b?t, ho?c vin nn. Ch?t di?t tinh trng ph?i ???c ??a vo m ??o b?ng m?t d?ng c? ??t tr??c khi quan h? tnh d?c t?i Azalynn?u l 10-15 pht ?? c th?i gian cho thu?c tc d?ng. Qu trnh ny ph?i ???c l?p l?i m?i l?n qu v? quan h? tnh d?c. Thu?c di?t tinh trng khng c?n k ??n. Trnh thai trong t? cung Vng trnh thai trong t? cung (IUD) IUD l d?ng c? hnh ch? T ???c ??t vo t? cung ph? n?. C hai lo?i vng trnh thai: IUD c hc mn.Lo?i ny c progestin, m?t d?ng t?ng h?p c?a hc mn progesterone. Lo?i ny c th? ?? t?i ch? trong 3-5 n?m. IUD b?ng ??ng.Lo?i ny ???c b?c b?ng dy ??ng. N c th? ?? t?i ch? trong 10 n?m. Cc ph??ng php trnh thai v?nh vi?n Th?t ?ng d?n tr?ng ? n? Trong ph??ng php ny, ?ng d?n tr?ng c?a ng??i ph? n? ???c b?t l?i, th?t, ho?c ch?n l?i trong khi ph?u thu?t ?? ng?n khng cho tr?ng ?i vo t? cung. Tri?t sa?n b??ng n?i soi t?? cung Trong ph??ng php ny, m?t ?ng m?m, nh? ???c lu?n vo t?ng ?ng d?n tr?ng. Cc ?ng lu?n ny lm cho m s?o hnh thnh trong cc ?ng d?n tr?ng v lm t?c cc ?ng d?n tr?ng ?? tinh trng khng th? g?p tr?ng. Th? thu?t ny m?t kho?ng 3 thng ?? c hi?u qu?. Qu v? ph?i s? d?ng m?t hnh th?c trnh thai khc trong 3 thng ny. Tri?t s?n nam ?y l th? thu?t ?? th?t ?ng d?n tinh d?ch (th?t ?ng d?n tinh). Sau khi lm th? thu?t, nam gi?i v?n c th? xu?t ra ch?t l?ng (tinh d?ch). Qu v? ph?i s? d?ng m?t hnh th?c trnh thai khc trong 3 thng sau th? thu?t ny. Cc ph??ng php k? ho?ch t? nhin K? ho?ch ha gia ?nh t? nhin Trong ph??ng php ny, c?p ?i khng quan h? tnh d?c vo   nh?ng ngy n? gi?i c th? th? thai. Ph??ng php theo l?ch ? bi?n php ny, ph? n? s? theo di ?? di m?i chu k? kinh nguy?t, nh?n di?n nh?ng ngy c th? th? thai v khng quan h? tnh d?c vo nh?ng ngy ?. Ph??ng php r?ng tr?ng Trong ph??ng php ny, c?p ?i trnh quan h? tnh d?c vo ngy r?ng tr?ng. Ph??ng  php tri?u ch?ng nhi?t Ph??ng php ny ngh?a l khng quan h? tnh d?c trong th?i gian r?ng tr?ng. Ph? n? th??ng ki?m tra tnh tr?ng r?ng tr?ng b?ng cch theo di nh?ng thay ??i v? nhi?t ?? v ?? ??c c?a d?ch nh?y c? t? cung. Ph??ng php sau r?ng tr?ng Trong ph??ng php ny, c?p ?i s? ??i ??n sau khi r?ng tr?ng ?? quan h? tnh d?c. N?i tm ki?m thm thng tin Centers for Disease Control and Prevention (Trung tm Ki?m sot v Phng ng?a D?ch b?nh): www.cdc.gov Tm t?t Trnh thai, hay cn g?i l ki?m sot vi?c c thai, ?? c?p ??n cc bi?n php ho?c d?ng c? ng?n ng?a c thai. Ph??ng php trnh thai b?ng hc mn bao g?m que c?y, thu?c tim, thu?c d?ng vin nn, mi?ng dn, vng ??t m ??o v thu?c trnh thai kh?n c?p. Ph??ng php trnh thai b?ng mng ng?n c th? bao g?m bao cao su dnh cho nam gi?i, bao cao su dnh cho n? gi?i, mng ch?n, m? c? t? cung, b?t bi?n v ch?t di?t tinh trng. C hai lo?i IUD (vng trnh thai trong t? cung). M?t IUD c th? ???c ??t trong t? cung ph? n? ?? ng?n ng?a mang thai trong 3-5 n?m. Tri?t s?n v?nh vi?n c th? ???c th?c hi?n thng qua m?t th? thu?t ? nam gi?i v n? gi?i. Cc ph??ng php k? ho?ch ha gia ?nh t? nhin l khngquan h? tnh d?c vo nh?ng ngy ph? n? c th? th? thai. Thng tin ny khng nh?m m?c ?ch thay th? cho l?i khuyn m chuyn gia ch?m sc s?c kh?e ni v?i qu v?. Hy b?o ??m qu v? ph?i th?o lu?n b?t k? v?n ?? g m qu v? c v?i chuyn gia ch?m sc s?c kh?e c?a qu v?. Document Revised: 10/26/2019 Document Reviewed: 10/26/2019 Elsevier Patient Education  2023 [employer]  

## 2022-04-11 NOTE — Progress Notes (Signed)
Date of Service: 04/10/2022  Interpreter present for visit  GYNECOLOGY OFFICE PROCEDURE NOTE  Kristin Shepherd is a 46 y.o. [id number] here for IUD removal. The IUD was placed nearly 5 years ago. She is informed that the device is now certified for 8 years of use. She prefers to have the IUD removed and start OCP so that she can know if she is still having periods. The patient currently has a Mirena IUD placed in 2019.  No GYN concerns.  Last pap smear was on 07/03/2017 and was normal. She accepts PAP today.  Review of Systems  Constitutional:  Negative for chills and fever.  HENT:  Negative for congestion, ear discharge, ear pain, hearing loss, sinus pain and sore throat.   Eyes:  Negative for blurred vision and double vision.  Respiratory:  Negative for cough, shortness of breath and wheezing.   Cardiovascular:  Negative for chest pain, palpitations and leg swelling.  Gastrointestinal:  Negative for abdominal pain, blood in stool, constipation, diarrhea, heartburn, melena, nausea and vomiting.  Genitourinary:  Negative for dysuria, flank pain, frequency, hematuria and urgency.  Musculoskeletal:  Negative for back pain, joint pain and myalgias.  Skin:  Negative for itching and rash.  Neurological:  Negative for dizziness, tingling, tremors, sensory change, speech change, focal weakness, seizures, loss of consciousness, weakness and headaches.  Endo/Heme/Allergies:  Negative for environmental allergies. Does not bruise/bleed easily.  Psychiatric/Behavioral:  Negative for depression, hallucinations, memory loss, substance abuse and suicidal ideas. The patient is not nervous/anxious and does not have insomnia.    Vital Signs: BP (!) 140/86   Pulse 88   Ht 5\' 4"  (1.626 m)   Wt 158 lb (71.7 kg)   BMI 27.12 kg/m  Constitutional: Well nourished, well developed female in no acute distress.  HEENT: normal Skin: Warm and dry.    Extremity:  no edema   Respiratory: Normal respiratory effort Psych: Alert  and Oriented x3. No memory deficits. Normal mood and affect.    Pelvic exam: (female chaperone present) is not limited by body habitus EGBUS: within normal limits Vagina: within normal limits and with normal mucosa Cervix: normal appearance   IUD Removal  Patient identified, informed consent performed, consent signed.  Time out was performed. Speculum placed in the vagina. The strings of the IUD were grasped and pulled using ring forceps. The IUD was successfully removed in its entirety.  Patient tolerated procedure well.   Patient was given post-procedure instructions.    46 y.o. G2 P2002 female s/p IUD removal, cervical cancer screening, initiation of OCP birth control  PAP smear Follow up as needed after lab results Rx 03-12-1984 (patient's previous and preferred OCP) Return to clinic PRN and for annual   Garnette Scheuermann, CNM [facility] 04/11/2022, 10:10 AM

## 2022-04-13 LAB — CYTOLOGY - PAP
Comment: NEGATIVE
Diagnosis: NEGATIVE
High risk HPV: NEGATIVE

## 2022-07-09 ENCOUNTER — Other Ambulatory Visit: Payer: Self-pay | Admitting: Nurse Practitioner

## 2022-07-09 ENCOUNTER — Other Ambulatory Visit: Payer: 59

## 2022-07-09 DIAGNOSIS — I10 Essential (primary) hypertension: Secondary | ICD-10-CM | POA: Diagnosis not present

## 2022-07-09 DIAGNOSIS — R5383 Other fatigue: Secondary | ICD-10-CM | POA: Diagnosis not present

## 2022-07-09 DIAGNOSIS — E785 Hyperlipidemia, unspecified: Secondary | ICD-10-CM | POA: Diagnosis not present

## 2022-07-09 DIAGNOSIS — R7303 Prediabetes: Secondary | ICD-10-CM | POA: Diagnosis not present

## 2022-07-10 LAB — COMPREHENSIVE METABOLIC PANEL
ALT: 14 IU/L (ref 0–32)
AST: 21 IU/L (ref 0–40)
Albumin/Globulin Ratio: 1.9 (ref 1.2–2.2)
Albumin: 4.6 g/dL (ref 3.9–4.9)
Alkaline Phosphatase: 62 IU/L (ref 44–121)
BUN/Creatinine Ratio: 21 (ref 9–23)
BUN: 16 mg/dL (ref 6–24)
Bilirubin Total: 0.4 mg/dL (ref 0.0–1.2)
CO2: 20 mmol/L (ref 20–29)
Calcium: 9.4 mg/dL (ref 8.7–10.2)
Chloride: 102 mmol/L (ref 96–106)
Creatinine, Ser: 0.75 mg/dL (ref 0.57–1.00)
Globulin, Total: 2.4 g/dL (ref 1.5–4.5)
Glucose: 105 mg/dL — ABNORMAL HIGH (ref 70–99)
Potassium: 4.2 mmol/L (ref 3.5–5.2)
Sodium: 139 mmol/L (ref 134–144)
Total Protein: 7 g/dL (ref 6.0–8.5)
eGFR: 99 mL/min/{1.73_m2} (ref >59)

## 2022-07-10 LAB — TSH: TSH: 4.01 u[IU]/mL (ref 0.450–4.500)

## 2022-07-10 LAB — LIPID PANEL W/O CHOL/HDL RATIO
Cholesterol, Total: 165 mg/dL (ref 100–199)
HDL: 53 mg/dL (ref >39)
LDL Chol Calc (NIH): 86 mg/dL (ref 0–99)
Triglycerides: 153 mg/dL — ABNORMAL HIGH (ref 0–149)
VLDL Cholesterol Cal: 26 mg/dL (ref 5–40)

## 2022-07-10 LAB — HGB A1C W/O EAG: Hgb A1c MFr Bld: 6 % — ABNORMAL HIGH (ref 4.8–5.6)

## 2022-07-11 ENCOUNTER — Ambulatory Visit (INDEPENDENT_AMBULATORY_CARE_PROVIDER_SITE_OTHER): Payer: 59 | Admitting: Nurse Practitioner

## 2022-07-11 VITALS — BP 132/74 | HR 81 | Ht 66.0 in | Wt 153.0 lb

## 2022-07-11 DIAGNOSIS — M779 Enthesopathy, unspecified: Secondary | ICD-10-CM | POA: Diagnosis not present

## 2022-07-11 DIAGNOSIS — J45909 Unspecified asthma, uncomplicated: Secondary | ICD-10-CM | POA: Insufficient documentation

## 2022-07-11 DIAGNOSIS — M79604 Pain in right leg: Secondary | ICD-10-CM | POA: Diagnosis not present

## 2022-07-11 NOTE — Progress Notes (Addendum)
Established Patient Office Visit  Subjective:  Patient ID: Fenet Guidetti, female    DOB: Jun 12, 1975  Age: 47 y.o. MRN: [id number]  Chief Complaint  Patient presents with   Follow-up    3 months follow up with lab  results    3 month follow up and review of recent fasting labs.  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  Also has some right posterior calf pain.       Past Medical History:  Diagnosis Date   Asthma    Cervical inflammation     Past Surgical History:  Procedure Laterality Date   APPENDECTOMY     BREAST BIOPSY Left    neg    Social History   Socioeconomic History   Marital status: Married    Spouse name: Not on file   Number of children: Not on file   Years of education: Not on file   Highest education level: Not on file  Occupational History   Not on file  Tobacco Use   Smoking status: Never   Smokeless tobacco: Never  Vaping Use   Vaping Use: Never used  Substance and Sexual Activity   Alcohol use: No   Drug use: No   Sexual activity: Yes    Birth control/protection: I.U.D.    Comment: Mirena  Other Topics Concern   Not on file  Social History Narrative   Not on file   Social Determinants of Health   Financial Resource Strain: Not on file  Food Insecurity: Not on file  Transportation Needs: Not on file  Physical Activity: Not on file  Stress: Not on file  Social Connections: Not on file  Intimate Partner Violence: Not on file    Family History  Problem Relation Age of Onset   Breast cancer Neg Hx     No Known Allergies  Review of Systems  Constitutional: Negative.   HENT: Negative.    Eyes: Negative.   Respiratory: Negative.    Cardiovascular: Negative.   Gastrointestinal: Negative.   Genitourinary: Negative.   Musculoskeletal:  Positive for joint pain and myalgias.  Skin: Negative.   Neurological: Negative.   Endo/Heme/Allergies: Negative.   Psychiatric/Behavioral: Negative.          Objective:   BP 132/74   Pulse 81   Ht '5\' 6"'$  (1.676 m)   Wt 153 lb (69.4 kg)   SpO2 97%   BMI 24.69 kg/m   Vitals:   07/11/22 0859  BP: 132/74  Pulse: 81  Height: '5\' 6"'$  (1.676 m)  Weight: 153 lb (69.4 kg)  SpO2: 97%  BMI (Calculated): 24.71    Physical Exam Vitals reviewed.  Constitutional:      Appearance: Normal appearance.  HENT:     Head: Normocephalic.     Nose: Nose normal.     Mouth/Throat:     Mouth: Mucous membranes are moist.  Eyes:     Pupils: Pupils are equal, round, and reactive to light.  Cardiovascular:     Rate and Rhythm: Normal rate and regular rhythm.  Pulmonary:     Effort: Pulmonary effort is normal.     Breath sounds: Normal breath sounds.  Abdominal:     General: Bowel sounds are normal.     Palpations: Abdomen is soft.  Musculoskeletal:        General: Tenderness present.     Cervical back: Normal range of motion and neck supple.  Skin:  General: Skin is warm and dry.  Neurological:     Mental Status: She is alert and oriented to person, place, and time.  Psychiatric:        Mood and Affect: Mood normal.        Behavior: Behavior normal.      No results found for any visits on 07/11/22.  Recent Results (from the past 2160 hour(s))  Comprehensive metabolic panel     Status: Abnormal   Collection Time: 07/09/22  9:08 AM  Result Value Ref Range   Glucose 105 (H) 70 - 99 mg/dL   BUN 16 6 - 24 mg/dL   Creatinine, Ser 0.75 0.57 - 1.00 mg/dL   eGFR 99 >59 mL/min/1.73   BUN/Creatinine Ratio 21 9 - 23   Sodium 139 134 - 144 mmol/L   Potassium 4.2 3.5 - 5.2 mmol/L   Chloride 102 96 - 106 mmol/L   CO2 20 20 - 29 mmol/L   Calcium 9.4 8.7 - 10.2 mg/dL   Total Protein 7.0 6.0 - 8.5 g/dL   Albumin 4.6 3.9 - 4.9 g/dL   Globulin, Total 2.4 1.5 - 4.5 g/dL   Albumin/Globulin Ratio 1.9 1.2 - 2.2   Bilirubin Total 0.4 0.0 - 1.2 mg/dL   Alkaline Phosphatase 62 44 - 121 IU/L   AST 21 0 - 40 IU/L   ALT 14 0 - 32 IU/L  Lipid  Panel w/o Chol/HDL Ratio     Status: Abnormal   Collection Time: 07/09/22  9:08 AM  Result Value Ref Range   Cholesterol, Total 165 100 - 199 mg/dL   Triglycerides 153 (H) 0 - 149 mg/dL   HDL 53 >39 mg/dL   VLDL Cholesterol Cal 26 5 - 40 mg/dL   LDL Chol Calc (NIH) 86 0 - 99 mg/dL  Hgb A1c w/o eAG     Status: Abnormal   Collection Time: 07/09/22  9:08 AM  Result Value Ref Range   Hgb A1c MFr Bld 6.0 (H) 4.8 - 5.6 %    Comment:          Prediabetes: 5.7 - 6.4          Diabetes: >6.4          Glycemic control for adults with diabetes: <7.0   TSH     Status: None   Collection Time: 07/09/22  9:08 AM  Result Value Ref Range   TSH 4.010 0.450 - 4.500 uIU/mL      Assessment & Plan:   Problem List Items Addressed This Visit       Respiratory   Mild asthma without complication     Musculoskeletal and Integument   Tendonitis     Other   Right leg pain - Primary   Relevant Orders   VAS [place] LOWER EXTREMITY VENOUS (DVT)    Return in about 3 months (around 10/11/2022).   Total time spent: 35 minutes  Evern Bio, NP  07/11/2022

## 2022-07-11 NOTE — Patient Instructions (Signed)
1) RLE Venous Doppler US - r/o DVT - posterior right calf pain 2) Do not sleep on right arm x 2 weeks - overuse syndrome 3) Follow up appt in 3 months, 2 weeks, fasting labs prior

## 2022-07-18 ENCOUNTER — Ambulatory Visit: Payer: 59

## 2022-07-18 DIAGNOSIS — M79604 Pain in right leg: Secondary | ICD-10-CM

## 2022-08-23 ENCOUNTER — Encounter: Payer: Self-pay | Admitting: Nurse Practitioner

## 2022-08-23 ENCOUNTER — Ambulatory Visit (INDEPENDENT_AMBULATORY_CARE_PROVIDER_SITE_OTHER): Payer: 59 | Admitting: Nurse Practitioner

## 2022-08-23 VITALS — BP 128/80 | HR 127 | Temp 98.9°F | Ht 64.0 in | Wt 156.0 lb

## 2022-08-23 DIAGNOSIS — J4520 Mild intermittent asthma, uncomplicated: Secondary | ICD-10-CM

## 2022-08-23 DIAGNOSIS — R5383 Other fatigue: Secondary | ICD-10-CM | POA: Insufficient documentation

## 2022-08-23 DIAGNOSIS — J069 Acute upper respiratory infection, unspecified: Secondary | ICD-10-CM

## 2022-08-23 HISTORY — DX: Acute upper respiratory infection, unspecified: J06.9

## 2022-08-23 MED ORDER — CEFDINIR 300 MG PO CAPS: 300.0000 mg | ORAL_CAPSULE | Freq: Two times a day (BID) | ORAL | 0 refills | Status: DC

## 2022-08-23 NOTE — Progress Notes (Signed)
Established Patient Office Visit  Subjective:  Patient ID: Kristin Shepherd, female    DOB: 06/28/1975  Age: 47 y.o. MRN: [id number]  Chief Complaint  Patient presents with   Acute Visit    Sore throat/body aches/fatigue/covid test negative    For two days patient has had ST, fever, fatigue, lymph node swelling, joints hurt.    No other concerns at this time.   Past Medical History:  Diagnosis Date   Asthma    Cervical inflammation     Past Surgical History:  Procedure Laterality Date   APPENDECTOMY     BREAST BIOPSY Left    neg    Social History   Socioeconomic History   Marital status: Married    Spouse name: Not on file   Number of children: Not on file   Years of education: Not on file   Highest education level: Not on file  Occupational History   Not on file  Tobacco Use   Smoking status: Never   Smokeless tobacco: Never  Vaping Use   Vaping Use: Never used  Substance and Sexual Activity   Alcohol use: No   Drug use: No   Sexual activity: Yes    Birth control/protection: I.U.D.    Comment: Mirena  Other Topics Concern   Not on file  Social History Narrative   Not on file   Social Determinants of Health   Financial Resource Strain: Not on file  Food Insecurity: Not on file  Transportation Needs: Not on file  Physical Activity: Not on file  Stress: Not on file  Social Connections: Not on file  Intimate Partner Violence: Not on file    Family History  Problem Relation Age of Onset   Breast cancer Neg Hx     No Known Allergies  Review of Systems  Constitutional:  Positive for chills, fever and malaise/fatigue.  HENT:  Positive for ear discharge and sore throat.   Eyes: Negative.   Respiratory: Negative.    Cardiovascular: Negative.   Gastrointestinal: Negative.   Genitourinary: Negative.   Musculoskeletal:  Positive for myalgias.  Skin: Negative.   Neurological: Negative.   Endo/Heme/Allergies: Negative.   Psychiatric/Behavioral:  Negative.         Objective:   BP 128/80   Pulse (!) 127   Temp 98.9 F (37.2 C) (Tympanic)   Ht  (1.626 m)   Wt 156 lb (70.8 kg)   SpO2 97%   BMI 26.78 kg/m   Vitals:   08/23/22 1043  BP: 128/80  Pulse: (!) 127  Temp: 98.9 F (37.2 C)  Height:  (1.626 m)  Weight: 156 lb (70.8 kg)  SpO2: 97%  TempSrc: Tympanic  BMI (Calculated): 26.76    Physical Exam Vitals reviewed.  Constitutional:      Appearance: Normal appearance.  HENT:     Head: Normocephalic.     Nose: Nose normal.     Mouth/Throat:     Mouth: Mucous membranes are moist.  Eyes:     Pupils: Pupils are equal, round, and reactive to light.  Cardiovascular:     Rate and Rhythm: Normal rate and regular rhythm.  Pulmonary:     Effort: Pulmonary effort is normal.     Breath sounds: Normal breath sounds.  Abdominal:     General: Bowel sounds are normal.     Palpations: Abdomen is soft.  Musculoskeletal:        General: Normal range of motion.     Cervical  back: Normal range of motion. Tenderness present.  Skin:    General: Skin is warm and dry.  Neurological:     Mental Status: She is alert and oriented to person, place, and time.  Psychiatric:        Mood and Affect: Mood normal.        Behavior: Behavior normal.      No results found for any visits on 08/23/22.  Recent Results (from the past 2160 hour(s))  Comprehensive metabolic panel     Status: Abnormal   Collection Time: 07/09/22  9:08 AM  Result Value Ref Range   Glucose 105 (H) 70 - 99 mg/dL   BUN 16 6 - 24 mg/dL   Creatinine, Ser 1.61 0.57 - 1.00 mg/dL   eGFR 99 >09 UE/AVW/0.98   BUN/Creatinine Ratio 21 9 - 23   Sodium 139 134 - 144 mmol/L   Potassium 4.2 3.5 - 5.2 mmol/L   Chloride 102 96 - 106 mmol/L   CO2 20 20 - 29 mmol/L   Calcium 9.4 8.7 - 10.2 mg/dL   Total Protein 7.0 6.0 - 8.5 g/dL   Albumin 4.6 3.9 - 4.9 g/dL   Globulin, Total 2.4 1.5 - 4.5 g/dL   Albumin/Globulin Ratio 1.9 1.2 - 2.2   Bilirubin Total 0.4  0.0 - 1.2 mg/dL   Alkaline Phosphatase 62 44 - 121 IU/L   AST 21 0 - 40 IU/L   ALT 14 0 - 32 IU/L  Lipid Panel w/o Chol/HDL Ratio     Status: Abnormal   Collection Time: 07/09/22  9:08 AM  Result Value Ref Range   Cholesterol, Total 165 100 - 199 mg/dL   Triglycerides 119 (H) 0 - 149 mg/dL   HDL 53 >14 mg/dL   VLDL Cholesterol Cal 26 5 - 40 mg/dL   LDL Chol Calc (NIH) 86 0 - 99 mg/dL  Hgb N8G w/o eAG     Status: Abnormal   Collection Time: 07/09/22  9:08 AM  Result Value Ref Range   Hgb A1c MFr Bld 6.0 (H) 4.8 - 5.6 %    Comment:          Prediabetes: 5.7 - 6.4          Diabetes: >6.4          Glycemic control for adults with diabetes: <7.0   TSH     Status: None   Collection Time: 07/09/22  9:08 AM  Result Value Ref Range   TSH 4.010 0.450 - 4.500 uIU/mL      Assessment & Plan:   Problem List Items Addressed This Visit   None   No follow-ups on file.   Total time spent: 35 minutes  Orson Eva, NP  08/23/2022

## 2022-08-23 NOTE — Patient Instructions (Signed)
1) Follow up appt in 1 week 2) Rest push water

## 2022-08-30 ENCOUNTER — Encounter: Payer: Self-pay | Admitting: Nurse Practitioner

## 2022-08-30 ENCOUNTER — Ambulatory Visit (INDEPENDENT_AMBULATORY_CARE_PROVIDER_SITE_OTHER): Payer: 59 | Admitting: Nurse Practitioner

## 2022-08-30 VITALS — BP 130/84 | HR 105 | Ht 64.0 in | Wt 154.8 lb

## 2022-08-30 DIAGNOSIS — J4520 Mild intermittent asthma, uncomplicated: Secondary | ICD-10-CM | POA: Diagnosis not present

## 2022-08-30 DIAGNOSIS — R5383 Other fatigue: Secondary | ICD-10-CM | POA: Diagnosis not present

## 2022-08-30 DIAGNOSIS — K219 Gastro-esophageal reflux disease without esophagitis: Secondary | ICD-10-CM

## 2022-08-30 DIAGNOSIS — J301 Allergic rhinitis due to pollen: Secondary | ICD-10-CM

## 2022-08-30 MED ORDER — CLONIDINE HCL 0.1 MG PO TABS: 0.1000 mg | ORAL_TABLET | Freq: Every day | ORAL | 11 refills | Status: DC

## 2022-08-30 NOTE — Progress Notes (Signed)
Established Patient Office Visit  Subjective:  Patient ID: Kristin Shepherd, female    DOB: [birth year]  Age: 47 y.o. MRN: [id number]  Chief Complaint  Patient presents with   Follow-up    1 week follow up    1 week follow up and patient feels improved from sinusitis, no symptoms resolved.    No other concerns at this time.   Past Medical History:  Diagnosis Date   Asthma    Cervical inflammation     Past Surgical History:  Procedure Laterality Date   APPENDECTOMY     BREAST BIOPSY Left    neg    Social History   Socioeconomic History   Marital status: Married    Spouse name: Not on file   Number of children: Not on file   Years of education: Not on file   Highest education level: Not on file  Occupational History   Not on file  Tobacco Use   Smoking status: Never   Smokeless tobacco: Never  Vaping Use   Vaping Use: Never used  Substance and Sexual Activity   Alcohol use: No   Drug use: No   Sexual activity: Yes    Birth control/protection: I.U.D.    Comment: Mirena  Other Topics Concern   Not on file  Social History Narrative   Not on file   Social Determinants of Health   Financial Resource Strain: Not on file  Food Insecurity: Not on file  Transportation Needs: Not on file  Physical Activity: Not on file  Stress: Not on file  Social Connections: Not on file  Intimate Partner Violence: Not on file    Family History  Problem Relation Age of Onset   Breast cancer Neg Hx     No Known Allergies  Review of Systems  Constitutional: Negative.   HENT: Negative.    Eyes: Negative.   Respiratory: Negative.    Cardiovascular: Negative.   Gastrointestinal: Negative.   Genitourinary: Negative.   Musculoskeletal: Negative.   Skin: Negative.   Neurological: Negative.   Endo/Heme/Allergies: Negative.   Psychiatric/Behavioral: Negative.         Objective:   BP 130/84   Pulse (!) 105   Ht  (1.626 m)   Wt 154 lb 12.8 oz (70.2 kg)   SpO2  98%   BMI 26.57 kg/m   Vitals:   08/30/22 0945  BP: 130/84  Pulse: (!) 105  Height:  (1.626 m)  Weight: 154 lb 12.8 oz (70.2 kg)  SpO2: 98%  BMI (Calculated): 26.56    Physical Exam Vitals reviewed.  Constitutional:      Appearance: Normal appearance.  HENT:     Head: Normocephalic.     Nose: Nose normal.     Mouth/Throat:     Mouth: Mucous membranes are moist.  Eyes:     Pupils: Pupils are equal, round, and reactive to light.  Cardiovascular:     Rate and Rhythm: Normal rate and regular rhythm.  Pulmonary:     Effort: Pulmonary effort is normal.     Breath sounds: Normal breath sounds.  Abdominal:     General: Bowel sounds are normal.     Palpations: Abdomen is soft.  Musculoskeletal:        General: Normal range of motion.     Cervical back: Normal range of motion and neck supple.  Skin:    General: Skin is warm and dry.  Neurological:     Mental Status: She is  alert and oriented to person, place, and time.  Psychiatric:        Mood and Affect: Mood normal.        Behavior: Behavior normal.      No results found for any visits on 08/30/22.  Recent Results (from the past 2160 hour(s))  Comprehensive metabolic panel     Status: Abnormal   Collection Time: 07/09/22  9:08 AM  Result Value Ref Range   Glucose 105 (H) 70 - 99 mg/dL   BUN 16 6 - 24 mg/dL   Creatinine, Ser 0.45 0.57 - 1.00 mg/dL   eGFR 99 >40 JW/JXB/1.47   BUN/Creatinine Ratio 21 9 - 23   Sodium 139 134 - 144 mmol/L   Potassium 4.2 3.5 - 5.2 mmol/L   Chloride 102 96 - 106 mmol/L   CO2 20 20 - 29 mmol/L   Calcium 9.4 8.7 - 10.2 mg/dL   Total Protein 7.0 6.0 - 8.5 g/dL   Albumin 4.6 3.9 - 4.9 g/dL   Globulin, Total 2.4 1.5 - 4.5 g/dL   Albumin/Globulin Ratio 1.9 1.2 - 2.2   Bilirubin Total 0.4 0.0 - 1.2 mg/dL   Alkaline Phosphatase 62 44 - 121 IU/L   AST 21 0 - 40 IU/L   ALT 14 0 - 32 IU/L  Lipid Panel w/o Chol/HDL Ratio     Status: Abnormal   Collection Time: 07/09/22  9:08 AM   Result Value Ref Range   Cholesterol, Total 165 100 - 199 mg/dL   Triglycerides 829 (H) 0 - 149 mg/dL   HDL 53 >56 mg/dL   VLDL Cholesterol Cal 26 5 - 40 mg/dL   LDL Chol Calc (NIH) 86 0 - 99 mg/dL  Hgb O1H w/o eAG     Status: Abnormal   Collection Time: 07/09/22  9:08 AM  Result Value Ref Range   Hgb A1c MFr Bld 6.0 (H) 4.8 - 5.6 %    Comment:          Prediabetes: 5.7 - 6.4          Diabetes: >6.4          Glycemic control for adults with diabetes: <7.0   TSH     Status: None   Collection Time: 07/09/22  9:08 AM  Result Value Ref Range   TSH 4.010 0.450 - 4.500 uIU/mL      Assessment & Plan:   Problem List Items Addressed This Visit       Respiratory   Mild asthma without complication - Primary   Allergic rhinitis due to pollen     Digestive   Gastroesophageal reflux disease     Other   Other fatigue    Return in about 3 months (around 11/29/2022).   Total time spent: 15 minutes  Orson Eva, NP  08/30/2022

## 2022-08-30 NOTE — Addendum Note (Signed)
Addended by: Orson Eva on: 08/30/2022 09:57 AM   Modules accepted: Orders

## 2022-08-30 NOTE — Patient Instructions (Addendum)
1)Keep June appt

## 2022-10-15 ENCOUNTER — Other Ambulatory Visit: Payer: 59

## 2022-10-15 ENCOUNTER — Ambulatory Visit: Payer: 59 | Admitting: Nurse Practitioner

## 2022-10-15 DIAGNOSIS — I10 Essential (primary) hypertension: Secondary | ICD-10-CM | POA: Diagnosis not present

## 2022-10-15 DIAGNOSIS — R5383 Other fatigue: Secondary | ICD-10-CM

## 2022-10-15 DIAGNOSIS — R7301 Impaired fasting glucose: Secondary | ICD-10-CM | POA: Diagnosis not present

## 2022-10-16 LAB — CMP14+EGFR
ALT: 27 IU/L (ref 0–32)
AST: 24 IU/L (ref 0–40)
Albumin/Globulin Ratio: 1.8
Albumin: 4.5 g/dL (ref 3.9–4.9)
Alkaline Phosphatase: 84 IU/L (ref 44–121)
BUN/Creatinine Ratio: 19 (ref 9–23)
BUN: 13 mg/dL (ref 6–24)
Bilirubin Total: 0.5 mg/dL (ref 0.0–1.2)
CO2: 27 mmol/L (ref 20–29)
Calcium: 9.4 mg/dL (ref 8.7–10.2)
Chloride: 101 mmol/L (ref 96–106)
Creatinine, Ser: 0.69 mg/dL (ref 0.57–1.00)
Globulin, Total: 2.5 g/dL (ref 1.5–4.5)
Glucose: 102 mg/dL — ABNORMAL HIGH (ref 70–99)
Potassium: 4.1 mmol/L (ref 3.5–5.2)
Sodium: 143 mmol/L (ref 134–144)
Total Protein: 7 g/dL (ref 6.0–8.5)
eGFR: 108 mL/min/{1.73_m2} (ref >59)

## 2022-10-16 LAB — HEMOGLOBIN A1C
Est. average glucose Bld gHb Est-mCnc: 128 mg/dL
Hgb A1c MFr Bld: 6.1 % — ABNORMAL HIGH (ref 4.8–5.6)

## 2022-10-16 LAB — CBC WITH DIFFERENTIAL
Basophils Absolute: 0 x10E3/uL (ref 0.0–0.2)
Basos: 1 %
EOS (ABSOLUTE): 0.2 x10E3/uL (ref 0.0–0.4)
Eos: 2 %
Hematocrit: 39.5 % (ref 34.0–46.6)
Hemoglobin: 13.2 g/dL (ref 11.1–15.9)
Immature Grans (Abs): 0 x10E3/uL (ref 0.0–0.1)
Immature Granulocytes: 0 %
Lymphocytes Absolute: 1.8 x10E3/uL (ref 0.7–3.1)
Lymphs: 25 %
MCH: 29.4 pg (ref 26.6–33.0)
MCHC: 33.4 g/dL (ref 31.5–35.7)
MCV: 88 fL (ref 79–97)
Monocytes Absolute: 0.6 x10E3/uL (ref 0.1–0.9)
Monocytes: 8 %
Neutrophils Absolute: 4.8 x10E3/uL (ref 1.4–7.0)
Neutrophils: 64 %
RBC: 4.49 x10E6/uL (ref 3.77–5.28)
RDW: 13.8 % (ref 11.7–15.4)
WBC: 7.4 x10E3/uL (ref 3.4–10.8)

## 2022-10-16 LAB — TSH: TSH: 3.26 u[IU]/mL (ref 0.450–4.500)

## 2022-10-16 LAB — LIPID PANEL
Chol/HDL Ratio: 3.1 ratio (ref 0.0–4.4)
Cholesterol, Total: 163 mg/dL (ref 100–199)
HDL: 53 mg/dL (ref >39)
LDL Chol Calc (NIH): 94 mg/dL (ref 0–99)
Triglycerides: 88 mg/dL (ref 0–149)
VLDL Cholesterol Cal: 16 mg/dL (ref 5–40)

## 2022-10-17 ENCOUNTER — Ambulatory Visit: Payer: 59 | Admitting: Nurse Practitioner

## 2022-10-26 ENCOUNTER — Ambulatory Visit: Payer: 59 | Admitting: Nurse Practitioner

## 2022-11-06 ENCOUNTER — Ambulatory Visit: Payer: 59 | Admitting: Nurse Practitioner

## 2022-11-27 ENCOUNTER — Ambulatory Visit: Payer: 59 | Admitting: Nurse Practitioner

## 2022-12-12 ENCOUNTER — Encounter: Payer: Self-pay | Admitting: Family

## 2022-12-12 ENCOUNTER — Ambulatory Visit (INDEPENDENT_AMBULATORY_CARE_PROVIDER_SITE_OTHER): Payer: 59 | Admitting: Family

## 2022-12-12 VITALS — BP 126/88 | HR 86 | Ht 65.0 in | Wt 153.2 lb

## 2022-12-12 DIAGNOSIS — J301 Allergic rhinitis due to pollen: Secondary | ICD-10-CM | POA: Diagnosis not present

## 2022-12-12 DIAGNOSIS — K219 Gastro-esophageal reflux disease without esophagitis: Secondary | ICD-10-CM | POA: Diagnosis not present

## 2022-12-12 DIAGNOSIS — R5383 Other fatigue: Secondary | ICD-10-CM

## 2022-12-12 DIAGNOSIS — J4520 Mild intermittent asthma, uncomplicated: Secondary | ICD-10-CM | POA: Diagnosis not present

## 2023-01-05 ENCOUNTER — Encounter: Payer: Self-pay | Admitting: Family

## 2023-01-05 NOTE — Progress Notes (Signed)
Established Patient Office Visit  Subjective:  Patient ID: Kristin Shepherd, female    DOB: 1976-04-26  Age: 47 y.o. MRN: [id number]  Chief Complaint  Patient presents with   Follow-up    3 month follow up, discuss lab results.    Patient is here today for her 3 months follow up.  She has been feeling well since last appointment.   She does not have additional concerns to discuss today.  Labs are due today. She needs refills.   I have reviewed her active problem list, medication list, allergies, notes from last encounter, lab results for her appointment today.   No other concerns at this time.   Past Medical History:  Diagnosis Date   Asthma    Cervical inflammation    Upper respiratory tract infection 08/23/2022    Past Surgical History:  Procedure Laterality Date   APPENDECTOMY     BREAST BIOPSY Left    neg    Social History   Socioeconomic History   Marital status: Married    Spouse name: Not on file   Number of children: Not on file   Years of education: Not on file   Highest education level: Not on file  Occupational History   Not on file  Tobacco Use   Smoking status: Never   Smokeless tobacco: Never  Vaping Use   Vaping status: Never Used  Substance and Sexual Activity   Alcohol use: No   Drug use: No   Sexual activity: Yes    Birth control/protection: I.U.D.    Comment: Mirena  Other Topics Concern   Not on file  Social History Narrative   Not on file   Social Determinants of Health   Financial Resource Strain: Not on file  Food Insecurity: Not on file  Transportation Needs: Not on file  Physical Activity: Not on file  Stress: Not on file  Social Connections: Not on file  Intimate Partner Violence: Not on file    Family History  Problem Relation Age of Onset   Breast cancer Neg Hx     No Known Allergies  Review of Systems  All other systems reviewed and are negative.      Objective:   BP 126/88   Pulse 86   Ht 5\' 5"  (1.651  m)   Wt 153 lb 3.2 oz (69.5 kg)   SpO2 97%   BMI 25.49 kg/m   Vitals:   12/12/22 1105  BP: 126/88  Pulse: 86  Height: 5\' 5"  (1.651 m)  Weight: 153 lb 3.2 oz (69.5 kg)  SpO2: 97%  BMI (Calculated): 25.49    Physical Exam Vitals and nursing note reviewed.  Constitutional:      Appearance: Normal appearance. She is normal weight.  HENT:     Head: Normocephalic.  Eyes:     Extraocular Movements: Extraocular movements intact.     Conjunctiva/sclera: Conjunctivae normal.     Pupils: Pupils are equal, round, and reactive to light.  Cardiovascular:     Rate and Rhythm: Normal rate.  Pulmonary:     Effort: Pulmonary effort is normal.  Neurological:     General: No focal deficit present.     Mental Status: She is alert and oriented to person, place, and time. Mental status is at baseline.  Psychiatric:        Mood and Affect: Mood normal.        Behavior: Behavior normal.        Thought Content: Thought content  normal.        Judgment: Judgment normal.      Results for orders placed or performed in visit on 12/12/22  Iron, TIBC and Ferritin Panel  Result Value Ref Range   Total Iron Binding Capacity 309 250 - 450 ug/dL   UIBC 295 284 - 132 ug/dL   Iron 96 27 - 440 ug/dL   Iron Saturation 31 15 - 55 %   Ferritin 345 (H) 15 - 150 ng/mL  CMP14+EGFR  Result Value Ref Range   Glucose 145 (H) 70 - 99 mg/dL   BUN 12 6 - 24 mg/dL   Creatinine, Ser 1.02 0.57 - 1.00 mg/dL   eGFR 99 >72 ZD/GUY/4.03   BUN/Creatinine Ratio 16 9 - 23   Sodium 142 134 - 144 mmol/L   Potassium 4.1 3.5 - 5.2 mmol/L   Chloride 103 96 - 106 mmol/L   CO2 24 20 - 29 mmol/L   Calcium 9.2 8.7 - 10.2 mg/dL   Total Protein 7.0 6.0 - 8.5 g/dL   Albumin 4.7 3.9 - 4.9 g/dL   Globulin, Total 2.3 1.5 - 4.5 g/dL   Bilirubin Total 0.4 0.0 - 1.2 mg/dL   Alkaline Phosphatase 80 44 - 121 IU/L   AST 21 0 - 40 IU/L   ALT 21 0 - 32 IU/L    Recent Results (from the past 2160 hour(s))  Hemoglobin A1c      Status: Abnormal   Collection Time: 10/15/22 12:21 PM  Result Value Ref Range   Hgb A1c MFr Bld 6.1 (H) 4.8 - 5.6 %    Comment:          Prediabetes: 5.7 - 6.4          Diabetes: >6.4          Glycemic control for adults with diabetes: <7.0    Est. average glucose Bld gHb Est-mCnc 128 mg/dL  TSH     Status: None   Collection Time: 10/15/22 12:21 PM  Result Value Ref Range   TSH 3.260 0.450 - 4.500 uIU/mL  CMP14+EGFR     Status: Abnormal   Collection Time: 10/15/22 12:21 PM  Result Value Ref Range   Glucose 102 (H) 70 - 99 mg/dL   BUN 13 6 - 24 mg/dL   Creatinine, Ser 4.74 0.57 - 1.00 mg/dL   eGFR 259 >56 LO/VFI/4.33   BUN/Creatinine Ratio 19 9 - 23   Sodium 143 134 - 144 mmol/L   Potassium 4.1 3.5 - 5.2 mmol/L   Chloride 101 96 - 106 mmol/L   CO2 27 20 - 29 mmol/L   Calcium 9.4 8.7 - 10.2 mg/dL   Total Protein 7.0 6.0 - 8.5 g/dL   Albumin 4.5 3.9 - 4.9 g/dL   Globulin, Total 2.5 1.5 - 4.5 g/dL   Albumin/Globulin Ratio 1.8    Bilirubin Total 0.5 0.0 - 1.2 mg/dL   Alkaline Phosphatase 84 44 - 121 IU/L   AST 24 0 - 40 IU/L   ALT 27 0 - 32 IU/L  Lipid panel     Status: None   Collection Time: 10/15/22 12:21 PM  Result Value Ref Range   Cholesterol, Total 163 100 - 199 mg/dL   Triglycerides 88 0 - 149 mg/dL   HDL 53 >29 mg/dL   VLDL Cholesterol Cal 16 5 - 40 mg/dL   LDL Chol Calc (NIH) 94 0 - 99 mg/dL   LDL CALC COMMENT: CANCELED     Comment: Test not  performed  Result canceled by the ancillary.    Chol/HDL Ratio 3.1 0.0 - 4.4 ratio    Comment:                                   T. Chol/HDL Ratio                                             Men  Women                               1/2 Avg.Risk  3.4    3.3                                   Avg.Risk  5.0    4.4                                2X Avg.Risk  9.6    7.1                                3X Avg.Risk 23.4   11.0   CBC With Differential     Status: None   Collection Time: 10/15/22 12:21 PM  Result Value Ref Range    WBC 7.4 3.4 - 10.8 x10E3/uL   RBC 4.49 3.77 - 5.28 x10E6/uL   Hemoglobin 13.2 11.1 - 15.9 g/dL   Hematocrit 96.0 45.4 - 46.6 %   MCV 88 79 - 97 fL   MCH 29.4 26.6 - 33.0 pg   MCHC 33.4 31.5 - 35.7 g/dL   RDW 09.8 11.9 - 14.7 %   Neutrophils 64 Not Estab. %   Lymphs 25 Not Estab. %   Monocytes 8 Not Estab. %   Eos 2 Not Estab. %   Basos 1 Not Estab. %   Neutrophils Absolute 4.8 1.4 - 7.0 x10E3/uL   Lymphocytes Absolute 1.8 0.7 - 3.1 x10E3/uL   Monocytes Absolute 0.6 0.1 - 0.9 x10E3/uL   EOS (ABSOLUTE) 0.2 0.0 - 0.4 x10E3/uL   Basophils Absolute 0.0 0.0 - 0.2 x10E3/uL   Immature Granulocytes 0 Not Estab. %   Immature Grans (Abs) 0.0 0.0 - 0.1 x10E3/uL    Comment: **Effective December 03, 2022, profile [id number] CBC/Differential**   (No Platelet) will be made non-orderable. [facility] Offers:   [id number] CBC With Differential/Platelet   Iron, TIBC and Ferritin Panel     Status: Abnormal   Collection Time: 12/12/22 11:38 AM  Result Value Ref Range   Total Iron Binding Capacity 309 250 - 450 ug/dL   UIBC 130 865 - 784 ug/dL   Iron 96 27 - 696 ug/dL   Iron Saturation 31 15 - 55 %   Ferritin 345 (H) 15 - 150 ng/mL  CMP14+EGFR     Status: Abnormal   Collection Time: 12/12/22 11:38 AM  Result Value Ref Range   Glucose 145 (H) 70 - 99 mg/dL   BUN 12 6 - 24 mg/dL   Creatinine, Ser 2.95 0.57 - 1.00 mg/dL   eGFR 99 >28 UX/LKG/4.01   BUN/Creatinine Ratio 16  9 - 23   Sodium 142 134 - 144 mmol/L   Potassium 4.1 3.5 - 5.2 mmol/L   Chloride 103 96 - 106 mmol/L   CO2 24 20 - 29 mmol/L   Calcium 9.2 8.7 - 10.2 mg/dL   Total Protein 7.0 6.0 - 8.5 g/dL   Albumin 4.7 3.9 - 4.9 g/dL   Globulin, Total 2.3 1.5 - 4.5 g/dL   Bilirubin Total 0.4 0.0 - 1.2 mg/dL   Alkaline Phosphatase 80 44 - 121 IU/L   AST 21 0 - 40 IU/L   ALT 21 0 - 32 IU/L       Assessment & Plan:   Problem List Items Addressed This Visit       Active Problems   Mild asthma without complication    Patient stable.  Well  controlled with current therapy.   Continue current meds.       Other fatigue - Primary   Relevant Orders   Iron, TIBC and Ferritin Panel (Completed)   CMP14+EGFR (Completed)   Allergic rhinitis due to pollen    Patient stable.  Well controlled with current therapy.   Continue current meds.       Gastroesophageal reflux disease    Patient stable.  Well controlled with current therapy.   Continue current meds.        Return in about 1 month (around 01/12/2023) for F/U.   Total time spent: 20 minutes  Miki Kins, FNP  12/12/2022   This document may have been prepared by [facility] Voice Recognition software and as such may include unintentional dictation errors.

## 2023-01-05 NOTE — Assessment & Plan Note (Signed)
Patient stable.  Well controlled with current therapy.   Continue current meds.  

## 2023-01-09 ENCOUNTER — Other Ambulatory Visit: Payer: 59

## 2023-01-15 ENCOUNTER — Other Ambulatory Visit: Payer: 59

## 2023-01-15 DIAGNOSIS — E785 Hyperlipidemia, unspecified: Secondary | ICD-10-CM | POA: Diagnosis not present

## 2023-01-15 DIAGNOSIS — I10 Essential (primary) hypertension: Secondary | ICD-10-CM

## 2023-01-15 DIAGNOSIS — R5383 Other fatigue: Secondary | ICD-10-CM | POA: Diagnosis not present

## 2023-01-16 ENCOUNTER — Ambulatory Visit (INDEPENDENT_AMBULATORY_CARE_PROVIDER_SITE_OTHER): Payer: 59 | Admitting: Family

## 2023-01-16 VITALS — BP 135/95 | HR 78 | Ht 65.0 in | Wt 155.2 lb

## 2023-01-16 DIAGNOSIS — R5383 Other fatigue: Secondary | ICD-10-CM | POA: Diagnosis not present

## 2023-01-16 DIAGNOSIS — E782 Mixed hyperlipidemia: Secondary | ICD-10-CM

## 2023-01-16 DIAGNOSIS — E559 Vitamin D deficiency, unspecified: Secondary | ICD-10-CM | POA: Diagnosis not present

## 2023-01-16 DIAGNOSIS — Z1231 Encounter for screening mammogram for malignant neoplasm of breast: Secondary | ICD-10-CM

## 2023-01-16 DIAGNOSIS — R7303 Prediabetes: Secondary | ICD-10-CM

## 2023-01-16 DIAGNOSIS — E538 Deficiency of other specified B group vitamins: Secondary | ICD-10-CM | POA: Diagnosis not present

## 2023-01-16 LAB — CBC WITH DIFFERENTIAL/PLATELET
Basophils Absolute: 0.1 10*3/uL (ref 0.0–0.2)
Basos: 1 %
EOS (ABSOLUTE): 0.1 10*3/uL (ref 0.0–0.4)
Eos: 2 %
Hematocrit: 39.9 % (ref 34.0–46.6)
Hemoglobin: 13.4 g/dL (ref 11.1–15.9)
Immature Grans (Abs): 0 10*3/uL (ref 0.0–0.1)
Immature Granulocytes: 0 %
Lymphocytes Absolute: 1.7 10*3/uL (ref 0.7–3.1)
Lymphs: 24 %
MCH: 29.8 pg (ref 26.6–33.0)
MCHC: 33.6 g/dL (ref 31.5–35.7)
MCV: 89 fL (ref 79–97)
Monocytes Absolute: 0.5 10*3/uL (ref 0.1–0.9)
Monocytes: 7 %
Neutrophils Absolute: 4.6 10*3/uL (ref 1.4–7.0)
Neutrophils: 66 %
Platelets: 255 10*3/uL (ref 150–450)
RBC: 4.5 x10E6/uL (ref 3.77–5.28)
RDW: 12.8 % (ref 11.7–15.4)
WBC: 7 10*3/uL (ref 3.4–10.8)

## 2023-01-16 LAB — HEMOGLOBIN A1C
Est. average glucose Bld gHb Est-mCnc: 128 mg/dL
Hgb A1c MFr Bld: 6.1 % — ABNORMAL HIGH (ref 4.8–5.6)

## 2023-01-16 LAB — CMP14+EGFR
ALT: 16 IU/L (ref 0–32)
AST: 20 IU/L (ref 0–40)
Albumin: 4.6 g/dL (ref 3.9–4.9)
Alkaline Phosphatase: 77 IU/L (ref 44–121)
BUN/Creatinine Ratio: 21 (ref 9–23)
BUN: 14 mg/dL (ref 6–24)
Bilirubin Total: 0.4 mg/dL (ref 0.0–1.2)
CO2: 26 mmol/L (ref 20–29)
Calcium: 9.1 mg/dL (ref 8.7–10.2)
Chloride: 104 mmol/L (ref 96–106)
Creatinine, Ser: 0.67 mg/dL (ref 0.57–1.00)
Globulin, Total: 2.3 g/dL (ref 1.5–4.5)
Glucose: 101 mg/dL — ABNORMAL HIGH (ref 70–99)
Potassium: 4.4 mmol/L (ref 3.5–5.2)
Sodium: 142 mmol/L (ref 134–144)
Total Protein: 6.9 g/dL (ref 6.0–8.5)
eGFR: 108 mL/min/{1.73_m2} (ref >59)

## 2023-01-16 LAB — LIPID PANEL
Chol/HDL Ratio: 3.4 ratio (ref 0.0–4.4)
Cholesterol, Total: 162 mg/dL (ref 100–199)
HDL: 47 mg/dL (ref >39)
LDL Chol Calc (NIH): 96 mg/dL (ref 0–99)
Triglycerides: 105 mg/dL (ref 0–149)
VLDL Cholesterol Cal: 19 mg/dL (ref 5–40)

## 2023-01-16 LAB — TSH: TSH: 2.16 u[IU]/mL (ref 0.450–4.500)

## 2023-01-27 ENCOUNTER — Encounter: Payer: Self-pay | Admitting: Family

## 2023-01-27 DIAGNOSIS — E782 Mixed hyperlipidemia: Secondary | ICD-10-CM | POA: Insufficient documentation

## 2023-01-27 DIAGNOSIS — R7303 Prediabetes: Secondary | ICD-10-CM | POA: Insufficient documentation

## 2023-01-27 NOTE — Progress Notes (Signed)
Established Patient Office Visit  Subjective:  Patient ID: Kristin Shepherd, female    DOB: 1976/03/20  Age: 47 y.o. MRN: [id number]  Chief Complaint  Patient presents with   Follow-up    1 mo f/u    Patient is here today for her 3 months follow up.  She has been feeling fairly well since last appointment.   She does not have additional concerns to discuss today.  Labs were done recently, so we will review these in detail today. She needs refills. She is also due for her mammogram, asks if we can send an order for her.   I have reviewed her active problem list, medication list, allergies, notes from last encounter, lab results for her appointment today.      No other concerns at this time.   Past Medical History:  Diagnosis Date   Asthma    Cervical inflammation    Upper respiratory tract infection 08/23/2022    Past Surgical History:  Procedure Laterality Date   APPENDECTOMY     BREAST BIOPSY Left    neg    Social History   Socioeconomic History   Marital status: Married    Spouse name: Not on file   Number of children: Not on file   Years of education: Not on file   Highest education level: Not on file  Occupational History   Not on file  Tobacco Use   Smoking status: Never   Smokeless tobacco: Never  Vaping Use   Vaping status: Never Used  Substance and Sexual Activity   Alcohol use: No   Drug use: No   Sexual activity: Yes    Birth control/protection: I.U.D.    Comment: Mirena  Other Topics Concern   Not on file  Social History Narrative   Not on file   Social Determinants of Health   Financial Resource Strain: Not on file  Food Insecurity: Not on file  Transportation Needs: Not on file  Physical Activity: Not on file  Stress: Not on file  Social Connections: Not on file  Intimate Partner Violence: Not on file    Family History  Problem Relation Age of Onset   Breast cancer Neg Hx     No Known Allergies  Review of Systems  All other  systems reviewed and are negative.      Objective:   BP (!) 135/95   Pulse 78   Ht 5\' 5"  (1.651 m)   Wt 155 lb 3.2 oz (70.4 kg)   SpO2 99%   BMI 25.83 kg/m   Vitals:   01/16/23 0944  BP: (!) 135/95  Pulse: 78  Height: 5\' 5"  (1.651 m)  Weight: 155 lb 3.2 oz (70.4 kg)  SpO2: 99%  BMI (Calculated): 25.83    Physical Exam Vitals and nursing note reviewed.  Constitutional:      Appearance: Normal appearance. She is normal weight.  HENT:     Head: Normocephalic.  Eyes:     Extraocular Movements: Extraocular movements intact.     Conjunctiva/sclera: Conjunctivae normal.     Pupils: Pupils are equal, round, and reactive to light.  Cardiovascular:     Rate and Rhythm: Normal rate.  Pulmonary:     Effort: Pulmonary effort is normal.  Musculoskeletal:        General: Normal range of motion.     Cervical back: Normal range of motion.  Neurological:     General: No focal deficit present.     Mental Status:  She is alert and oriented to person, place, and time. Mental status is at baseline.  Psychiatric:        Mood and Affect: Mood normal.        Behavior: Behavior normal.        Thought Content: Thought content normal.        Judgment: Judgment normal.      No results found for any visits on 01/16/23.  Recent Results (from the past 2160 hour(s))  Iron, TIBC and Ferritin Panel     Status: Abnormal   Collection Time: 12/12/22 11:38 AM  Result Value Ref Range   Total Iron Binding Capacity 309 250 - 450 ug/dL   UIBC 829 562 - 130 ug/dL   Iron 96 27 - 865 ug/dL   Iron Saturation 31 15 - 55 %   Ferritin 345 (H) 15 - 150 ng/mL  CMP14+EGFR     Status: Abnormal   Collection Time: 12/12/22 11:38 AM  Result Value Ref Range   Glucose 145 (H) 70 - 99 mg/dL   BUN 12 6 - 24 mg/dL   Creatinine, Ser 7.84 0.57 - 1.00 mg/dL   eGFR 99 >69 GE/XBM/8.41   BUN/Creatinine Ratio 16 9 - 23   Sodium 142 134 - 144 mmol/L   Potassium 4.1 3.5 - 5.2 mmol/L   Chloride 103 96 - 106 mmol/L    CO2 24 20 - 29 mmol/L   Calcium 9.2 8.7 - 10.2 mg/dL   Total Protein 7.0 6.0 - 8.5 g/dL   Albumin 4.7 3.9 - 4.9 g/dL   Globulin, Total 2.3 1.5 - 4.5 g/dL   Bilirubin Total 0.4 0.0 - 1.2 mg/dL   Alkaline Phosphatase 80 44 - 121 IU/L   AST 21 0 - 40 IU/L   ALT 21 0 - 32 IU/L  Hemoglobin A1c     Status: Abnormal   Collection Time: 01/15/23 10:35 AM  Result Value Ref Range   Hgb A1c MFr Bld 6.1 (H) 4.8 - 5.6 %    Comment:          Prediabetes: 5.7 - 6.4          Diabetes: >6.4          Glycemic control for adults with diabetes: <7.0    Est. average glucose Bld gHb Est-mCnc 128 mg/dL  TSH     Status: None   Collection Time: 01/15/23 10:35 AM  Result Value Ref Range   TSH 2.160 0.450 - 4.500 uIU/mL  CMP14+EGFR     Status: Abnormal   Collection Time: 01/15/23 10:35 AM  Result Value Ref Range   Glucose 101 (H) 70 - 99 mg/dL   BUN 14 6 - 24 mg/dL   Creatinine, Ser 3.24 0.57 - 1.00 mg/dL   eGFR 401 >02 VO/ZDG/6.44   BUN/Creatinine Ratio 21 9 - 23   Sodium 142 134 - 144 mmol/L   Potassium 4.4 3.5 - 5.2 mmol/L   Chloride 104 96 - 106 mmol/L   CO2 26 20 - 29 mmol/L   Calcium 9.1 8.7 - 10.2 mg/dL   Total Protein 6.9 6.0 - 8.5 g/dL   Albumin 4.6 3.9 - 4.9 g/dL   Globulin, Total 2.3 1.5 - 4.5 g/dL   Bilirubin Total 0.4 0.0 - 1.2 mg/dL   Alkaline Phosphatase 77 44 - 121 IU/L   AST 20 0 - 40 IU/L   ALT 16 0 - 32 IU/L  Lipid panel     Status: None   Collection  Time: 01/15/23 10:35 AM  Result Value Ref Range   Cholesterol, Total 162 100 - 199 mg/dL   Triglycerides 725 0 - 149 mg/dL   HDL 47 >36 mg/dL   VLDL Cholesterol Cal 19 5 - 40 mg/dL   LDL Chol Calc (NIH) 96 0 - 99 mg/dL   Chol/HDL Ratio 3.4 0.0 - 4.4 ratio    Comment:                                   T. Chol/HDL Ratio                                             Men  Women                               1/2 Avg.Risk  3.4    3.3                                   Avg.Risk  5.0    4.4                                2X Avg.Risk   9.6    7.1                                3X Avg.Risk 23.4   11.0   CBC with Differential/Platelet     Status: None   Collection Time: 01/15/23 10:35 AM  Result Value Ref Range   WBC 7.0 3.4 - 10.8 x10E3/uL   RBC 4.50 3.77 - 5.28 x10E6/uL   Hemoglobin 13.4 11.1 - 15.9 g/dL   Hematocrit 64.4 03.4 - 46.6 %   MCV 89 79 - 97 fL   MCH 29.8 26.6 - 33.0 pg   MCHC 33.6 31.5 - 35.7 g/dL   RDW 74.2 59.5 - 63.8 %   Platelets 255 150 - 450 x10E3/uL   Neutrophils 66 Not Estab. %   Lymphs 24 Not Estab. %   Monocytes 7 Not Estab. %   Eos 2 Not Estab. %   Basos 1 Not Estab. %   Neutrophils Absolute 4.6 1.4 - 7.0 x10E3/uL   Lymphocytes Absolute 1.7 0.7 - 3.1 x10E3/uL   Monocytes Absolute 0.5 0.1 - 0.9 x10E3/uL   EOS (ABSOLUTE) 0.1 0.0 - 0.4 x10E3/uL   Basophils Absolute 0.1 0.0 - 0.2 x10E3/uL   Immature Granulocytes 0 Not Estab. %   Immature Grans (Abs) 0.0 0.0 - 0.1 x10E3/uL       Assessment & Plan:   Problem List Items Addressed This Visit       Active Problems   Other fatigue   Prediabetes - Primary    Patient educated on foods that contain carbohydrates and the need to decrease intake.  We discussed prediabetes, and what it means and the need for strict dietary control to prevent progression to type 2 diabetes.  Advised to decrease intake of sugary drinks, including sodas, sweet tea, and some juices, and of starch and sugar heavy foods (ie., potatoes, rice, bread, pasta, desserts). She  verbalizes understanding and agreement with the changes discussed today.  Patient educated on foods that contain carbohydrates and the need to decrease intake.  We discussed prediabetes, and what it means and the need for strict dietary control to prevent progression to type 2 diabetes.  Advised to decrease intake of sugary drinks, including sodas, sweet tea, and some juices, and of starch and sugar heavy foods (ie., potatoes, rice, bread, pasta, desserts). She verbalizes understanding and agreement with  the changes discussed today.        Mixed hyperlipidemia    Continue current therapy for lipid control. Will modify as needed based on labwork results.        Other Visit Diagnoses     B12 deficiency due to diet       Vitamin D deficiency, unspecified       Screening mammogram for breast cancer       Relevant Orders   MM 3D SCREENING MAMMOGRAM BILATERAL BREAST       Return in about 3 months (around 04/17/2023) for F/U.   Total time spent: 20 minutes  Miki Kins, FNP  01/16/2023   This document may have been prepared by [facility] Voice Recognition software and as such may include unintentional dictation errors.

## 2023-01-27 NOTE — Assessment & Plan Note (Addendum)
Continue current therapy for lipid control. Will modify as needed based on labwork results.   

## 2023-01-27 NOTE — Assessment & Plan Note (Signed)
Patient educated on foods that contain carbohydrates and the need to decrease intake.  We discussed prediabetes, and what it means and the need for strict dietary control to prevent progression to type 2 diabetes.  Advised to decrease intake of sugary drinks, including sodas, sweet tea, and some juices, and of starch and sugar heavy foods (ie., potatoes, rice, bread, pasta, desserts). She verbalizes understanding and agreement with the changes discussed today.  Patient educated on foods that contain carbohydrates and the need to decrease intake.  We discussed prediabetes, and what it means and the need for strict dietary control to prevent progression to type 2 diabetes.  Advised to decrease intake of sugary drinks, including sodas, sweet tea, and some juices, and of starch and sugar heavy foods (ie., potatoes, rice, bread, pasta, desserts). She verbalizes understanding and agreement with the changes discussed today.

## 2023-02-06 ENCOUNTER — Ambulatory Visit (INDEPENDENT_AMBULATORY_CARE_PROVIDER_SITE_OTHER): Payer: 59 | Admitting: Cardiology

## 2023-02-06 ENCOUNTER — Other Ambulatory Visit: Payer: Self-pay | Admitting: Cardiology

## 2023-02-06 DIAGNOSIS — Z23 Encounter for immunization: Secondary | ICD-10-CM | POA: Diagnosis not present

## 2023-02-06 NOTE — Progress Notes (Signed)
flu

## 2023-02-25 ENCOUNTER — Encounter: Payer: Self-pay | Admitting: Family

## 2023-03-13 ENCOUNTER — Ambulatory Visit (INDEPENDENT_AMBULATORY_CARE_PROVIDER_SITE_OTHER): Payer: 59 | Admitting: Family

## 2023-03-13 ENCOUNTER — Encounter: Payer: Self-pay | Admitting: Family

## 2023-03-13 VITALS — BP 125/72 | HR 79 | Ht 64.0 in | Wt 156.7 lb

## 2023-03-13 DIAGNOSIS — R051 Acute cough: Secondary | ICD-10-CM | POA: Diagnosis not present

## 2023-03-13 DIAGNOSIS — J069 Acute upper respiratory infection, unspecified: Secondary | ICD-10-CM | POA: Diagnosis not present

## 2023-03-13 DIAGNOSIS — Z0130 Encounter for examination of blood pressure without abnormal findings: Secondary | ICD-10-CM

## 2023-03-13 MED ORDER — LEVOFLOXACIN 750 MG PO TABS: 750.0000 mg | ORAL_TABLET | Freq: Every day | ORAL | 0 refills | Status: DC

## 2023-03-13 MED ORDER — BENZONATATE 100 MG PO CAPS: 100.0000 mg | ORAL_CAPSULE | Freq: Three times a day (TID) | ORAL | 1 refills | Status: DC | PRN

## 2023-03-13 NOTE — Patient Instructions (Signed)
 [facility] [address], [address] [place],  [place]  [address]  Main: [phone]

## 2023-03-13 NOTE — Progress Notes (Signed)
Acute Office Visit  Subjective:     Patient ID: Kristin Shepherd, female    DOB: 27-Aug-1975, 47 y.o.   MRN: [id number]  Patient is in today for  Chief Complaint  Patient presents with   Cough    Cough started Sunday  Been taking otc cough medication and tylenol  Having upper back pain when coughing    Cough This is a new problem. The current episode started in the past 7 days. The problem has been unchanged. The problem occurs every few minutes. The cough is Productive of sputum. Associated symptoms include nasal congestion, postnasal drip, rhinorrhea and a sore throat. Pertinent negatives include no fever or shortness of breath. Nothing aggravates the symptoms. She has tried OTC cough suppressant for the symptoms. The treatment provided no relief.     Review of Systems  Constitutional:  Positive for malaise/fatigue. Negative for fever.  HENT:  Positive for congestion, postnasal drip, rhinorrhea and sore throat.   Respiratory:  Positive for cough. Negative for shortness of breath.   All other systems reviewed and are negative.       Objective:    BP 125/72   Pulse 79   Ht 5\' 4"  (1.626 m)   Wt 156 lb 11.2 oz (71.1 kg)   SpO2 98%   BMI 26.90 kg/m   Physical Exam Vitals and nursing note reviewed.  Constitutional:      Appearance: Normal appearance. She is normal weight.  HENT:     Head: Normocephalic.  Eyes:     Extraocular Movements: Extraocular movements intact.     Conjunctiva/sclera: Conjunctivae normal.     Pupils: Pupils are equal, round, and reactive to light.  Cardiovascular:     Rate and Rhythm: Normal rate.  Pulmonary:     Effort: Pulmonary effort is normal.     Breath sounds: Wheezing present.  Chest:     Chest wall: Tenderness (2/2 cough) present.  Neurological:     General: No focal deficit present.     Mental Status: She is alert and oriented to person, place, and time. Mental status is at baseline.  Psychiatric:        Mood and Affect: Mood normal.         Behavior: Behavior normal.        Thought Content: Thought content normal.     No results found for any visits on 03/13/23.  Recent Results (from the past 2160 hour(s))  Hemoglobin A1c     Status: Abnormal   Collection Time: 01/15/23 10:35 AM  Result Value Ref Range   Hgb A1c MFr Bld 6.1 (H) 4.8 - 5.6 %    Comment:          Prediabetes: 5.7 - 6.4          Diabetes: >6.4          Glycemic control for adults with diabetes: <7.0    Est. average glucose Bld gHb Est-mCnc 128 mg/dL  TSH     Status: None   Collection Time: 01/15/23 10:35 AM  Result Value Ref Range   TSH 2.160 0.450 - 4.500 uIU/mL  CMP14+EGFR     Status: Abnormal   Collection Time: 01/15/23 10:35 AM  Result Value Ref Range   Glucose 101 (H) 70 - 99 mg/dL   BUN 14 6 - 24 mg/dL   Creatinine, Ser 4.16 0.57 - 1.00 mg/dL   eGFR 606 >30 ZS/WFU/9.32   BUN/Creatinine Ratio 21 9 - 23   Sodium 142  134 - 144 mmol/L   Potassium 4.4 3.5 - 5.2 mmol/L   Chloride 104 96 - 106 mmol/L   CO2 26 20 - 29 mmol/L   Calcium 9.1 8.7 - 10.2 mg/dL   Total Protein 6.9 6.0 - 8.5 g/dL   Albumin 4.6 3.9 - 4.9 g/dL   Globulin, Total 2.3 1.5 - 4.5 g/dL   Bilirubin Total 0.4 0.0 - 1.2 mg/dL   Alkaline Phosphatase 77 44 - 121 IU/L   AST 20 0 - 40 IU/L   ALT 16 0 - 32 IU/L  Lipid panel     Status: None   Collection Time: 01/15/23 10:35 AM  Result Value Ref Range   Cholesterol, Total 162 100 - 199 mg/dL   Triglycerides 295 0 - 149 mg/dL   HDL 47 >62 mg/dL   VLDL Cholesterol Cal 19 5 - 40 mg/dL   LDL Chol Calc (NIH) 96 0 - 99 mg/dL   Chol/HDL Ratio 3.4 0.0 - 4.4 ratio    Comment:                                   T. Chol/HDL Ratio                                             Men  Women                               1/2 Avg.Risk  3.4    3.3                                   Avg.Risk  5.0    4.4                                2X Avg.Risk  9.6    7.1                                3X Avg.Risk 23.4   11.0   CBC with  Differential/Platelet     Status: None   Collection Time: 01/15/23 10:35 AM  Result Value Ref Range   WBC 7.0 3.4 - 10.8 x10E3/uL   RBC 4.50 3.77 - 5.28 x10E6/uL   Hemoglobin 13.4 11.1 - 15.9 g/dL   Hematocrit 13.0 86.5 - 46.6 %   MCV 89 79 - 97 fL   MCH 29.8 26.6 - 33.0 pg   MCHC 33.6 31.5 - 35.7 g/dL   RDW 78.4 69.6 - 29.5 %   Platelets 255 150 - 450 x10E3/uL   Neutrophils 66 Not Estab. %   Lymphs 24 Not Estab. %   Monocytes 7 Not Estab. %   Eos 2 Not Estab. %   Basos 1 Not Estab. %   Neutrophils Absolute 4.6 1.4 - 7.0 x10E3/uL   Lymphocytes Absolute 1.7 0.7 - 3.1 x10E3/uL   Monocytes Absolute 0.5 0.1 - 0.9 x10E3/uL   EOS (ABSOLUTE) 0.1 0.0 - 0.4 x10E3/uL   Basophils Absolute 0.1 0.0 - 0.2 x10E3/uL   Immature Granulocytes 0 Not Estab. %   Immature  Grans (Abs) 0.0 0.0 - 0.1 x10E3/uL       Assessment & Plan:   Problem List Items Addressed This Visit   None Visit Diagnoses     Acute cough    -  Primary   Upper respiratory tract infection, unspecified type          Sending antibiotics and cough suppressants for pt so she can get some sleep.  I asked to get cxr today, but patient would like to defer to see if antibiotics help first.   Return as previously scheduled unless not improving.  Total time spent: 20 minutes  Miki Kins, FNP  03/13/2023   This document may have been prepared by [facility] Voice Recognition software and as such may include unintentional dictation errors.

## 2023-03-19 ENCOUNTER — Other Ambulatory Visit: Payer: Self-pay | Admitting: Family

## 2023-03-19 DIAGNOSIS — N6489 Other specified disorders of breast: Secondary | ICD-10-CM

## 2023-04-17 ENCOUNTER — Other Ambulatory Visit: Payer: Self-pay | Admitting: Family

## 2023-04-17 ENCOUNTER — Ambulatory Visit
Admission: RE | Admit: 2023-04-17 | Discharge: 2023-04-17 | Disposition: A | Discharge status: Home / Self Care | Payer: 59 | Source: Ambulatory Visit | Attending: Family | Admitting: Family

## 2023-04-17 DIAGNOSIS — R928 Other abnormal and inconclusive findings on diagnostic imaging of breast: Secondary | ICD-10-CM | POA: Diagnosis not present

## 2023-04-17 DIAGNOSIS — R92333 Mammographic heterogeneous density, bilateral breasts: Secondary | ICD-10-CM | POA: Diagnosis not present

## 2023-04-17 DIAGNOSIS — N6489 Other specified disorders of breast: Secondary | ICD-10-CM | POA: Diagnosis not present

## 2023-04-17 DIAGNOSIS — Z1231 Encounter for screening mammogram for malignant neoplasm of breast: Secondary | ICD-10-CM

## 2023-04-22 ENCOUNTER — Other Ambulatory Visit: Payer: 59

## 2023-04-22 DIAGNOSIS — E782 Mixed hyperlipidemia: Secondary | ICD-10-CM | POA: Diagnosis not present

## 2023-04-22 DIAGNOSIS — E538 Deficiency of other specified B group vitamins: Secondary | ICD-10-CM

## 2023-04-22 DIAGNOSIS — E559 Vitamin D deficiency, unspecified: Secondary | ICD-10-CM

## 2023-04-22 DIAGNOSIS — R946 Abnormal results of thyroid function studies: Secondary | ICD-10-CM | POA: Diagnosis not present

## 2023-04-22 DIAGNOSIS — R5383 Other fatigue: Secondary | ICD-10-CM | POA: Diagnosis not present

## 2023-04-22 DIAGNOSIS — R7303 Prediabetes: Secondary | ICD-10-CM | POA: Diagnosis not present

## 2023-04-23 LAB — CBC WITH DIFFERENTIAL/PLATELET
Basophils Absolute: 0 10*3/uL (ref 0.0–0.2)
Basos: 1 %
EOS (ABSOLUTE): 0.1 10*3/uL (ref 0.0–0.4)
Eos: 2 %
Hematocrit: 41.6 % (ref 34.0–46.6)
Hemoglobin: 13.8 g/dL (ref 11.1–15.9)
Immature Grans (Abs): 0 10*3/uL (ref 0.0–0.1)
Immature Granulocytes: 0 %
Lymphocytes Absolute: 2 10*3/uL (ref 0.7–3.1)
Lymphs: 27 %
MCH: 29.7 pg (ref 26.6–33.0)
MCHC: 33.2 g/dL (ref 31.5–35.7)
MCV: 90 fL (ref 79–97)
Monocytes Absolute: 0.5 10*3/uL (ref 0.1–0.9)
Monocytes: 7 %
Neutrophils Absolute: 4.7 10*3/uL (ref 1.4–7.0)
Neutrophils: 63 %
Platelets: 238 10*3/uL (ref 150–450)
RBC: 4.65 x10E6/uL (ref 3.77–5.28)
RDW: 13.4 % (ref 11.7–15.4)
WBC: 7.4 10*3/uL (ref 3.4–10.8)

## 2023-04-23 LAB — CMP14+EGFR
ALT: 18 [IU]/L (ref 0–32)
AST: 23 [IU]/L (ref 0–40)
Albumin: 4.5 g/dL (ref 3.9–4.9)
Alkaline Phosphatase: 72 [IU]/L (ref 44–121)
BUN/Creatinine Ratio: 18 (ref 9–23)
BUN: 14 mg/dL (ref 6–24)
Bilirubin Total: 0.7 mg/dL (ref 0.0–1.2)
CO2: 21 mmol/L (ref 20–29)
Calcium: 9 mg/dL (ref 8.7–10.2)
Chloride: 103 mmol/L (ref 96–106)
Creatinine, Ser: 0.79 mg/dL (ref 0.57–1.00)
Globulin, Total: 2.4 g/dL (ref 1.5–4.5)
Glucose: 99 mg/dL (ref 70–99)
Potassium: 4.1 mmol/L (ref 3.5–5.2)
Sodium: 141 mmol/L (ref 134–144)
Total Protein: 6.9 g/dL (ref 6.0–8.5)
eGFR: 92 mL/min/{1.73_m2} (ref >59)

## 2023-04-23 LAB — HEMOGLOBIN A1C
Est. average glucose Bld gHb Est-mCnc: 126 mg/dL
Hgb A1c MFr Bld: 6 % — ABNORMAL HIGH (ref 4.8–5.6)

## 2023-04-23 LAB — LIPID PANEL
Chol/HDL Ratio: 4.8 {ratio} — ABNORMAL HIGH (ref 0.0–4.4)
Cholesterol, Total: 216 mg/dL — ABNORMAL HIGH (ref 100–199)
HDL: 45 mg/dL (ref >39)
LDL Chol Calc (NIH): 149 mg/dL — ABNORMAL HIGH (ref 0–99)
Triglycerides: 124 mg/dL (ref 0–149)
VLDL Cholesterol Cal: 22 mg/dL (ref 5–40)

## 2023-04-23 LAB — VITAMIN B12: Vitamin B-12: 477 pg/mL (ref 232–1245)

## 2023-04-23 LAB — VITAMIN D 25 HYDROXY (VIT D DEFICIENCY, FRACTURES): Vit D, 25-Hydroxy: 29.7 ng/mL — ABNORMAL LOW (ref 30.0–100.0)

## 2023-04-23 LAB — TSH: TSH: 4.8 u[IU]/mL — ABNORMAL HIGH (ref 0.450–4.500)

## 2023-04-24 ENCOUNTER — Other Ambulatory Visit: Payer: Self-pay

## 2023-04-24 ENCOUNTER — Ambulatory Visit (INDEPENDENT_AMBULATORY_CARE_PROVIDER_SITE_OTHER): Payer: 59 | Admitting: Family

## 2023-04-24 ENCOUNTER — Encounter: Payer: Self-pay | Admitting: Family

## 2023-04-24 ENCOUNTER — Other Ambulatory Visit: Payer: Self-pay | Admitting: Family

## 2023-04-24 VITALS — BP 130/84 | HR 83 | Ht 64.0 in | Wt 158.0 lb

## 2023-04-24 DIAGNOSIS — R5383 Other fatigue: Secondary | ICD-10-CM

## 2023-04-24 DIAGNOSIS — E782 Mixed hyperlipidemia: Secondary | ICD-10-CM | POA: Diagnosis not present

## 2023-04-24 DIAGNOSIS — R7303 Prediabetes: Secondary | ICD-10-CM | POA: Diagnosis not present

## 2023-04-24 MED ORDER — MONTELUKAST SODIUM 10 MG PO TABS: 10.0000 mg | ORAL_TABLET | Freq: Every morning | ORAL | 3 refills | Status: DC

## 2023-04-24 MED ORDER — ROSUVASTATIN CALCIUM 20 MG PO TABS: 20.0000 mg | ORAL_TABLET | Freq: Every day | ORAL | 3 refills | Status: DC

## 2023-04-25 LAB — T4: T4, Total: 9.3 ug/dL (ref 4.5–12.0)

## 2023-04-25 LAB — SPECIMEN STATUS REPORT

## 2023-04-25 LAB — T3: T3, Total: 133 ng/dL (ref 71–180)

## 2023-05-10 NOTE — Progress Notes (Signed)
 Established Patient Office Visit  Subjective:  Patient ID: Kristin Shepherd, female    DOB: March 27, 1976  Age: 48 y.o. MRN: [id number]  Chief Complaint  Patient presents with   Follow-up    3 month follow up    Patient is here today for her 3 months follow up.  She has been feeling well since last appointment.   She does not have additional concerns to discuss today.  Labs were done previously, so we will review these today.   She needs refills.   I have reviewed her active problem list, medication list, allergies, notes from last encounter, lab results for her appointment today.      No other concerns at this time.   Past Medical History:  Diagnosis Date   Asthma    Cervical inflammation    Upper respiratory tract infection 08/23/2022    Past Surgical History:  Procedure Laterality Date   APPENDECTOMY     BREAST BIOPSY Left    neg    Social History   Socioeconomic History   Marital status: Married    Spouse name: Not on file   Number of children: Not on file   Years of education: Not on file   Highest education level: Not on file  Occupational History   Not on file  Tobacco Use   Smoking status: Never   Smokeless tobacco: Never  Vaping Use   Vaping status: Never Used  Substance and Sexual Activity   Alcohol use: No   Drug use: No   Sexual activity: Yes    Birth control/protection: I.U.D.    Comment: Mirena  Other Topics Concern   Not on file  Social History Narrative   Not on file   Social Drivers of Health   Financial Resource Strain: Not on file  Food Insecurity: Not on file  Transportation Needs: Not on file  Physical Activity: Not on file  Stress: Not on file  Social Connections: Not on file  Intimate Partner Violence: Not on file    Family History  Problem Relation Age of Onset   Breast cancer Neg Hx     No Known Allergies  Review of Systems  All other systems reviewed and are negative.      Objective:   BP 130/84   Pulse 83    Ht 5\' 4"  (1.626 m)   Wt 158 lb (71.7 kg)   LMP 04/07/2023 (Approximate)   SpO2 98%   BMI 27.12 kg/m   Vitals:   04/24/23 0854  BP: 130/84  Pulse: 83  Height: 5\' 4"  (1.626 m)  Weight: 158 lb (71.7 kg)  SpO2: 98%  BMI (Calculated): 27.11    Physical Exam Vitals and nursing note reviewed.  Constitutional:      Appearance: Normal appearance. She is normal weight.  HENT:     Head: Normocephalic.  Eyes:     Extraocular Movements: Extraocular movements intact.     Conjunctiva/sclera: Conjunctivae normal.     Pupils: Pupils are equal, round, and reactive to light.  Cardiovascular:     Rate and Rhythm: Normal rate.  Pulmonary:     Effort: Pulmonary effort is normal.  Neurological:     General: No focal deficit present.     Mental Status: She is alert and oriented to person, place, and time. Mental status is at baseline.  Psychiatric:        Mood and Affect: Mood normal.        Behavior: Behavior normal.  Thought Content: Thought content normal.      No results found for any visits on 04/24/23.  Recent Results (from the past 2160 hours)  Lipid panel     Status: Abnormal   Collection Time: 04/22/23  9:18 AM  Result Value Ref Range   Cholesterol, Total 216 (H) 100 - 199 mg/dL   Triglycerides 161 0 - 149 mg/dL   HDL 45 >09 mg/dL   VLDL Cholesterol Cal 22 5 - 40 mg/dL   LDL Chol Calc (NIH) 604 (H) 0 - 99 mg/dL   Chol/HDL Ratio 4.8 (H) 0.0 - 4.4 ratio    Comment:                                   T. Chol/HDL Ratio                                             Men  Women                               1/2 Avg.Risk  3.4    3.3                                   Avg.Risk  5.0    4.4                                2X Avg.Risk  9.6    7.1                                3X Avg.Risk 23.4   11.0   VITAMIN D 25 Hydroxy (Vit-D Deficiency, Fractures)     Status: Abnormal   Collection Time: 04/22/23  9:18 AM  Result Value Ref Range   Vit D, 25-Hydroxy 29.7 (L) 30.0 - 100.0  ng/mL    Comment: Vitamin D deficiency has been defined by the Institute of Medicine and an Endocrine Society practice guideline as a level of serum 25-OH vitamin D less than 20 ng/mL (1,2). The Endocrine Society went on to further define vitamin D insufficiency as a level between 21 and 29 ng/mL (2). 1. IOM (Institute of Medicine). 2010. Dietary reference    intakes for calcium and D. [place]: The    [employer]. 2. Holick MF, Binkley [place], Bischoff-Ferrari HA, et al.    Evaluation, treatment, and prevention of vitamin D    deficiency: an Endocrine Society clinical practice    guideline. JCEM. 2011 Jul; 96(7):1911-30.   CMP14+EGFR     Status: None   Collection Time: 04/22/23  9:18 AM  Result Value Ref Range   Glucose 99 70 - 99 mg/dL   BUN 14 6 - 24 mg/dL   Creatinine, Ser 5.40 0.57 - 1.00 mg/dL   eGFR 92 >98 JX/BJY/7.82   BUN/Creatinine Ratio 18 9 - 23   Sodium 141 134 - 144 mmol/L   Potassium 4.1 3.5 - 5.2 mmol/L   Chloride 103 96 - 106 mmol/L   CO2 21 20 - 29 mmol/L   Calcium 9.0 8.7 -  10.2 mg/dL   Total Protein 6.9 6.0 - 8.5 g/dL   Albumin 4.5 3.9 - 4.9 g/dL   Globulin, Total 2.4 1.5 - 4.5 g/dL   Bilirubin Total 0.7 0.0 - 1.2 mg/dL   Alkaline Phosphatase 72 44 - 121 IU/L   AST 23 0 - 40 IU/L   ALT 18 0 - 32 IU/L  TSH     Status: Abnormal   Collection Time: 04/22/23  9:18 AM  Result Value Ref Range   TSH 4.800 (H) 0.450 - 4.500 uIU/mL  Hemoglobin A1c     Status: Abnormal   Collection Time: 04/22/23  9:18 AM  Result Value Ref Range   Hgb A1c MFr Bld 6.0 (H) 4.8 - 5.6 %    Comment:          Prediabetes: 5.7 - 6.4          Diabetes: >6.4          Glycemic control for adults with diabetes: <7.0    Est. average glucose Bld gHb Est-mCnc 126 mg/dL  Vitamin Z61     Status: None   Collection Time: 04/22/23  9:18 AM  Result Value Ref Range   Vitamin B-12 477 232 - 1,245 pg/mL  CBC with Diff     Status: None   Collection Time: 04/22/23  9:18 AM  Result  Value Ref Range   WBC 7.4 3.4 - 10.8 x10E3/uL   RBC 4.65 3.77 - 5.28 x10E6/uL   Hemoglobin 13.8 11.1 - 15.9 g/dL   Hematocrit 09.6 04.5 - 46.6 %   MCV 90 79 - 97 fL   MCH 29.7 26.6 - 33.0 pg   MCHC 33.2 31.5 - 35.7 g/dL   RDW 40.9 81.1 - 91.4 %   Platelets 238 150 - 450 x10E3/uL   Neutrophils 63 Not Estab. %   Lymphs 27 Not Estab. %   Monocytes 7 Not Estab. %   Eos 2 Not Estab. %   Basos 1 Not Estab. %   Neutrophils Absolute 4.7 1.4 - 7.0 x10E3/uL   Lymphocytes Absolute 2.0 0.7 - 3.1 x10E3/uL   Monocytes Absolute 0.5 0.1 - 0.9 x10E3/uL   EOS (ABSOLUTE) 0.1 0.0 - 0.4 x10E3/uL   Basophils Absolute 0.0 0.0 - 0.2 x10E3/uL   Immature Granulocytes 0 Not Estab. %   Immature Grans (Abs) 0.0 0.0 - 0.1 x10E3/uL  T4     Status: None   Collection Time: 04/22/23  9:18 AM  Result Value Ref Range   T4, Total 9.3 4.5 - 12.0 ug/dL  T3     Status: None   Collection Time: 04/22/23  9:18 AM  Result Value Ref Range   T3, Total 133 71 - 180 ng/dL  Specimen status report     Status: None   Collection Time: 04/22/23  9:18 AM  Result Value Ref Range   specimen status report Comment     Comment: Written Authorization Written Authorization Written Authorization Received. Authorization received from [employer]  for [employer] on 04-24-2023 Logged by Lawernce Pitts        Assessment & Plan:   Problem List Items Addressed This Visit       Other   Other fatigue   Patient  Will continue supplements as needed.        Prediabetes   Patient educated on foods that contain carbohydrates and the need to decrease intake.  We discussed prediabetes, and what it means and the need for strict dietary control to prevent  progression to type 2 diabetes.  Advised to decrease intake of sugary drinks, including sodas, sweet tea, and some juices, and of starch and sugar heavy foods (ie., potatoes, rice, bread, pasta, desserts). She verbalizes understanding and agreement with the changes discussed today.   A1C Continues to be in prediabetic ranges.  Will reassess at follow up after next lab check.  Patient counseled on dietary choices and verbalized understanding.        Mixed hyperlipidemia - Primary   Continue current therapy for lipid control. Will modify as needed based on labwork results.         Return in about 3 months (around 07/23/2023) for F/U.   Total time spent: 20 minutes  Miki Kins, FNP  04/24/2023   This document may have been prepared by [facility] Voice Recognition software and as such may include unintentional dictation errors.

## 2023-05-16 ENCOUNTER — Other Ambulatory Visit: Payer: Self-pay

## 2023-05-17 ENCOUNTER — Other Ambulatory Visit: Payer: Self-pay

## 2023-07-03 IMAGING — MG DIGITAL DIAGNOSTIC BILAT W/ TOMO W/ CAD
6 of 12 series · 6 of 36 positions shown · non-contrast
Comparison: Previous exam(s).

CLINICAL DATA: Right inner breast asymmetry, seen on mammography
dated May 2018.

EXAM:
DIGITAL DIAGNOSTIC BILATERAL MAMMOGRAM WITH TOMOSYNTHESIS AND CAD;
ULTRASOUND RIGHT BREAST LIMITED
TECHNIQUE: Bilateral digital diagnostic mammography and breast tomosynthesis
was performed. The images were evaluated with computer-aided
detection.; Targeted ultrasound examination of the right breast was
performed

[L MLO synth-2D (1 of 2)]
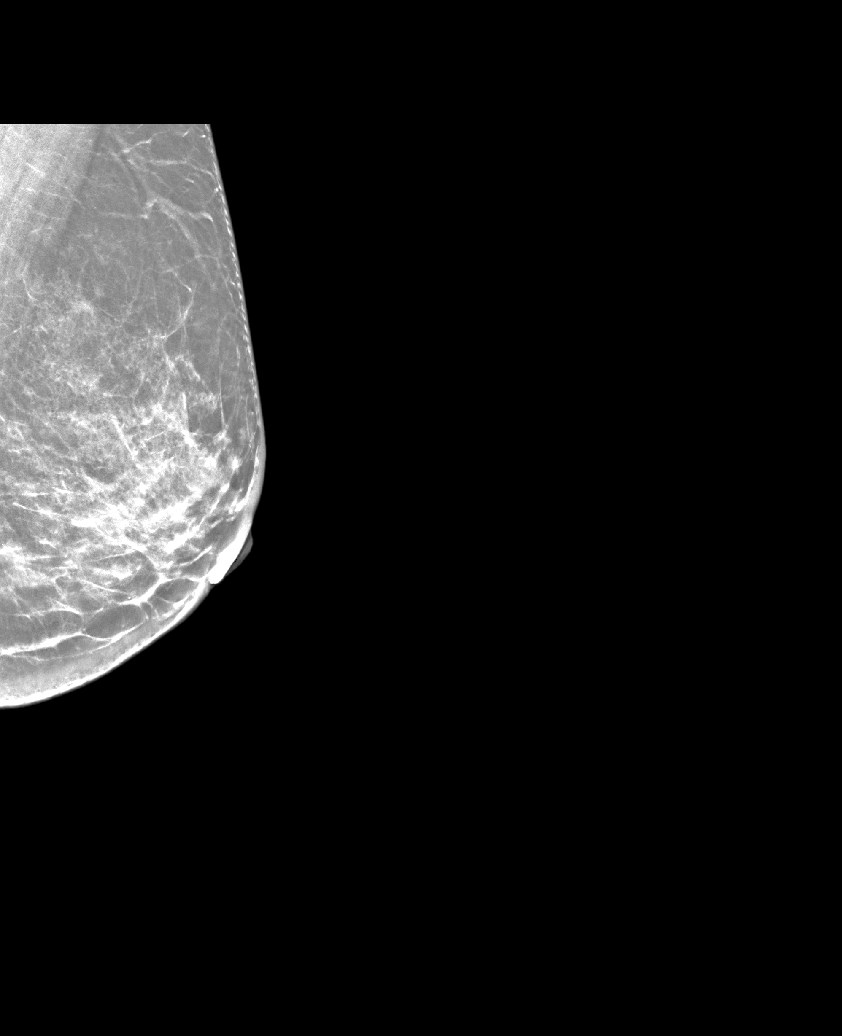

[R MLO synth-2D (1 of 2)]
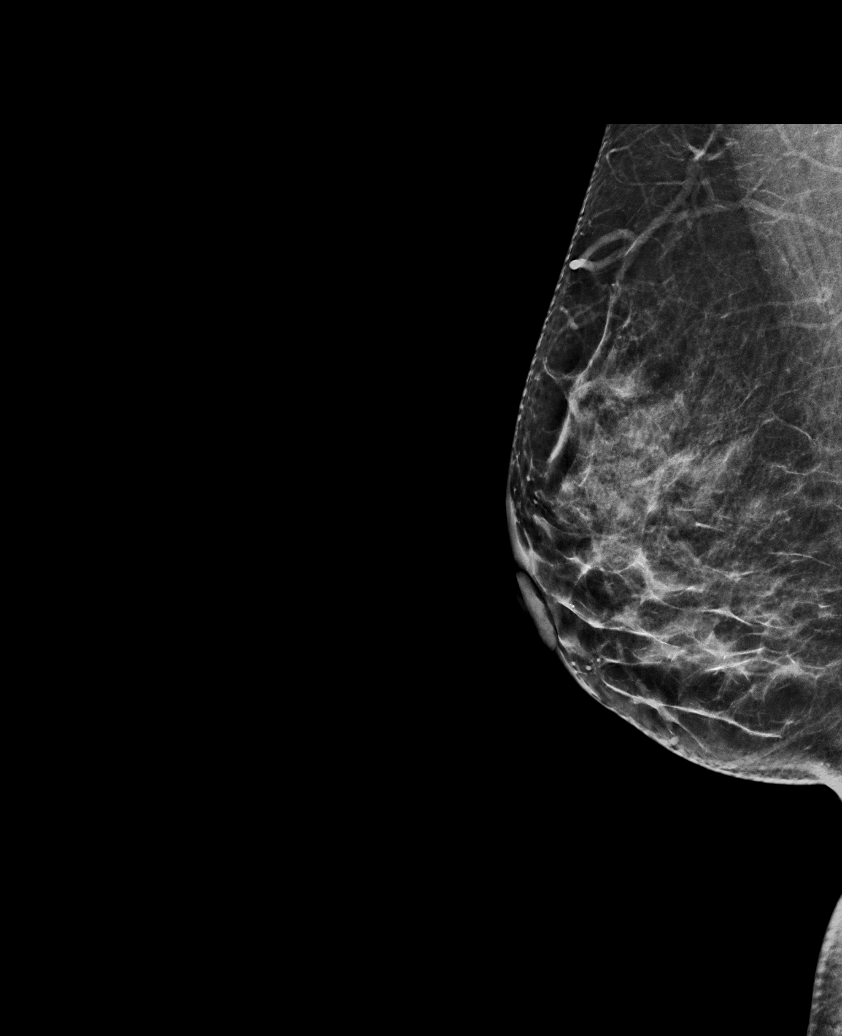

[L MLO synth-2D (2 of 2)]
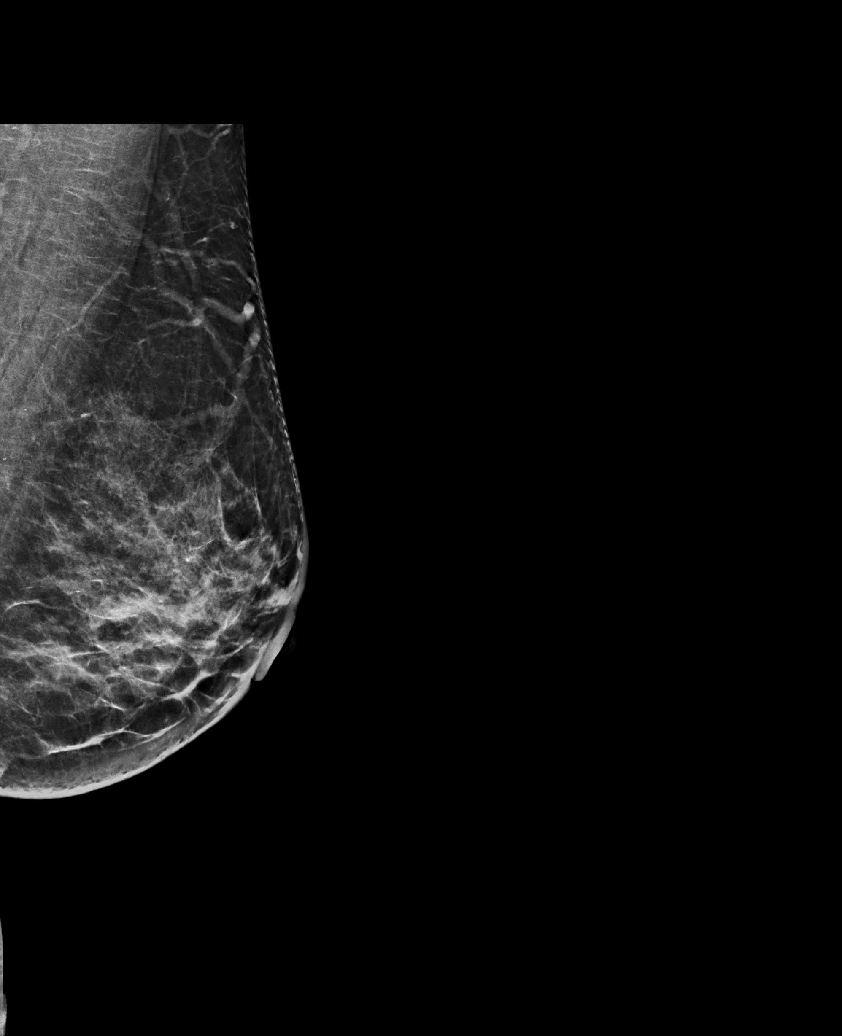

[L CC synth-2D]
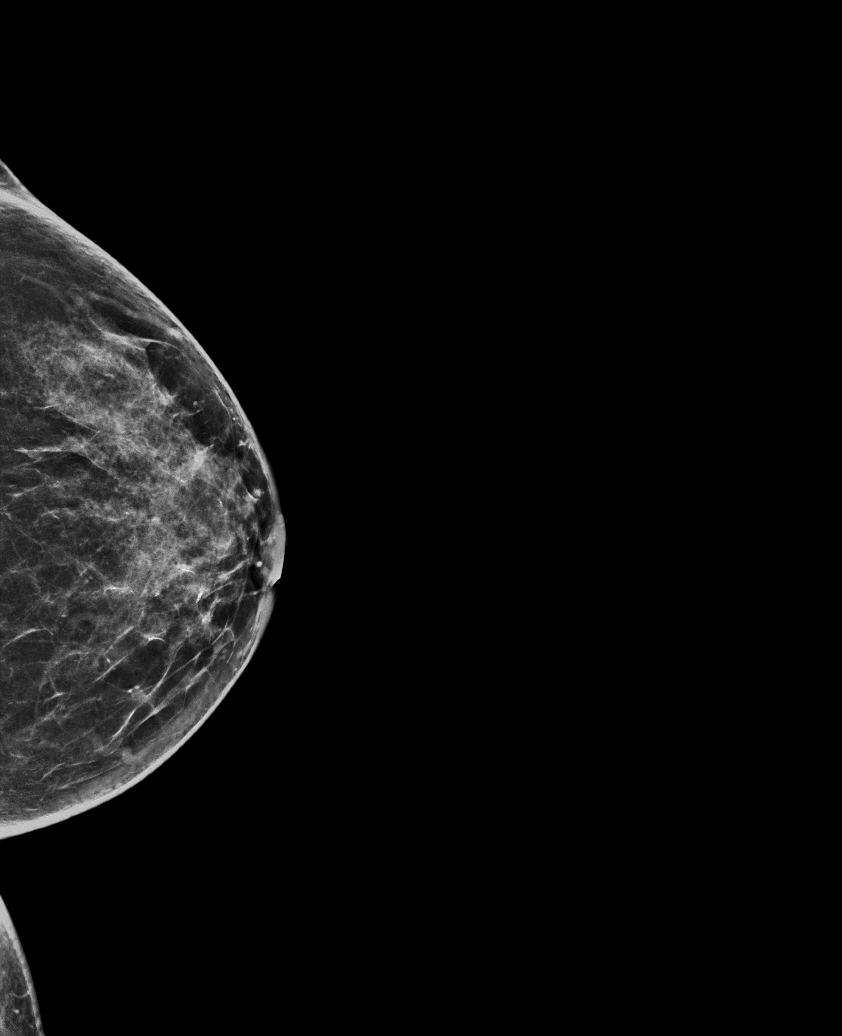

[R MLO synth-2D (2 of 2)]
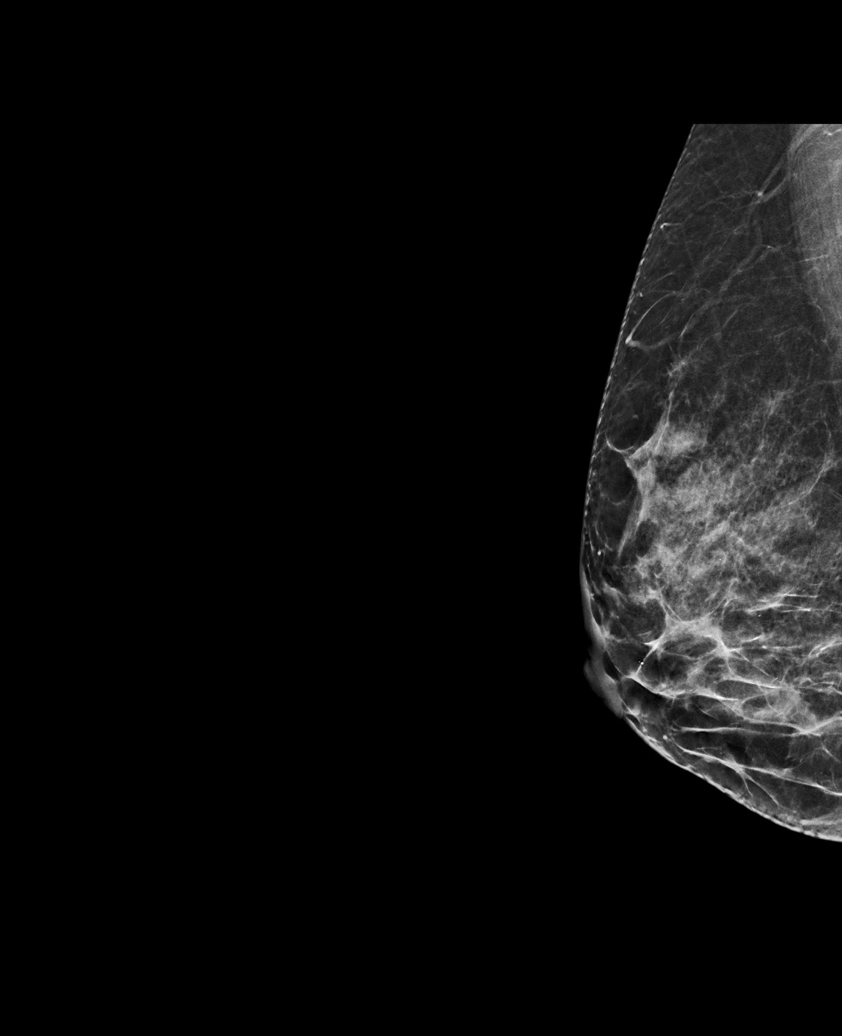

[R CC synth-2D]
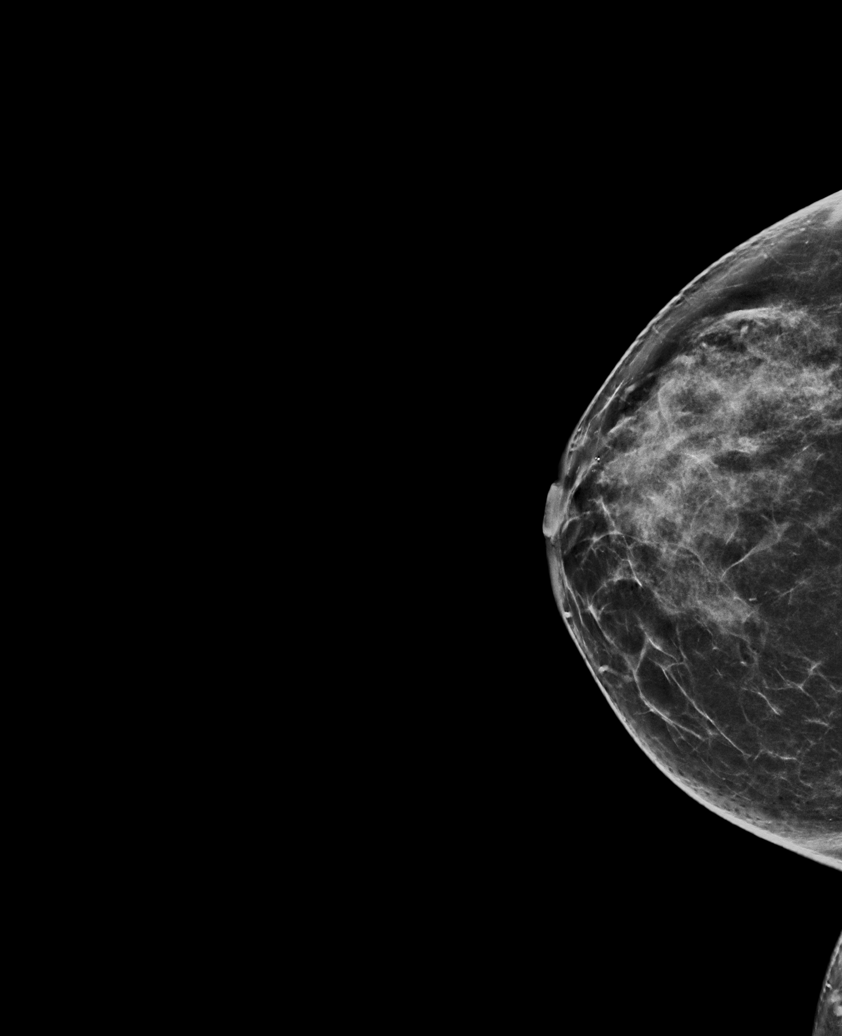

[6 of 36 positions shown; findings below may reference images not displayed]

ACR Breast Density Category c: The breast tissue is heterogeneously
dense, which may obscure small masses.
FINDINGS: Additional mammographic views of the right breast demonstrate
persistent asymmetry in the inner right breast, middle depth
measuring approximately 1 cm, seen on the craniocaudal view. No
definite correlation is seen on the MLO view

Targeted ultrasound of the inner right breast demonstrates no
suspicious masses or shadowing lesions. No sonographic correlation
is seen to the probably benign asymmetry in the inner right breast.
IMPRESSION: Right inner breast probably benign asymmetry, for which short-term
imaging follow-up is recommended.

RECOMMENDATION:
Right diagnostic mammogram and if needed focused right breast
ultrasound in 6 months.

I have discussed the findings and recommendations with the patient.
If applicable, a reminder letter will be sent to the patient
regarding the next appointment.

BI-RADS CATEGORY  3: Probably benign.

## 2023-07-07 NOTE — Assessment & Plan Note (Signed)
 Patient  Will continue supplements as needed.

## 2023-07-07 NOTE — Assessment & Plan Note (Signed)
 Continue current therapy for lipid control. Will modify as needed based on labwork results.

## 2023-07-07 NOTE — Assessment & Plan Note (Signed)

## 2023-07-18 ENCOUNTER — Other Ambulatory Visit

## 2023-07-18 DIAGNOSIS — E559 Vitamin D deficiency, unspecified: Secondary | ICD-10-CM

## 2023-07-18 DIAGNOSIS — I10 Essential (primary) hypertension: Secondary | ICD-10-CM

## 2023-07-18 DIAGNOSIS — E782 Mixed hyperlipidemia: Secondary | ICD-10-CM | POA: Diagnosis not present

## 2023-07-18 DIAGNOSIS — E538 Deficiency of other specified B group vitamins: Secondary | ICD-10-CM

## 2023-07-18 DIAGNOSIS — R7303 Prediabetes: Secondary | ICD-10-CM | POA: Diagnosis not present

## 2023-07-19 LAB — CMP14+EGFR
ALT: 18 IU/L (ref 0–32)
AST: 21 IU/L (ref 0–40)
Albumin: 4.5 g/dL (ref 3.9–4.9)
Alkaline Phosphatase: 72 IU/L (ref 44–121)
BUN/Creatinine Ratio: 20 (ref 9–23)
BUN: 14 mg/dL (ref 6–24)
Bilirubin Total: 0.7 mg/dL (ref 0.0–1.2)
CO2: 23 mmol/L (ref 20–29)
Calcium: 9.2 mg/dL (ref 8.7–10.2)
Chloride: 103 mmol/L (ref 96–106)
Creatinine, Ser: 0.71 mg/dL (ref 0.57–1.00)
Globulin, Total: 2.3 g/dL (ref 1.5–4.5)
Glucose: 110 mg/dL — ABNORMAL HIGH (ref 70–99)
Potassium: 4.1 mmol/L (ref 3.5–5.2)
Sodium: 142 mmol/L (ref 134–144)
Total Protein: 6.8 g/dL (ref 6.0–8.5)
eGFR: 105 mL/min/{1.73_m2} (ref >59)

## 2023-07-19 LAB — TSH: TSH: 2.79 u[IU]/mL (ref 0.450–4.500)

## 2023-07-19 LAB — LIPID PANEL
Chol/HDL Ratio: 5.1 ratio — ABNORMAL HIGH (ref 0.0–4.4)
Cholesterol, Total: 194 mg/dL (ref 100–199)
HDL: 38 mg/dL — ABNORMAL LOW (ref >39)
LDL Chol Calc (NIH): 130 mg/dL — ABNORMAL HIGH (ref 0–99)
Triglycerides: 142 mg/dL (ref 0–149)
VLDL Cholesterol Cal: 26 mg/dL (ref 5–40)

## 2023-07-19 LAB — HEMOGLOBIN A1C
Est. average glucose Bld gHb Est-mCnc: 131 mg/dL
Hgb A1c MFr Bld: 6.2 % — ABNORMAL HIGH (ref 4.8–5.6)

## 2023-07-19 LAB — VITAMIN D 25 HYDROXY (VIT D DEFICIENCY, FRACTURES): Vit D, 25-Hydroxy: 22.4 ng/mL — ABNORMAL LOW (ref 30.0–100.0)

## 2023-07-19 LAB — VITAMIN B12: Vitamin B-12: 483 pg/mL (ref 232–1245)

## 2023-07-24 ENCOUNTER — Ambulatory Visit (INDEPENDENT_AMBULATORY_CARE_PROVIDER_SITE_OTHER): Payer: 59 | Admitting: Family

## 2023-07-24 ENCOUNTER — Encounter: Payer: Self-pay | Admitting: Family

## 2023-07-24 ENCOUNTER — Other Ambulatory Visit: Payer: Self-pay

## 2023-07-24 VITALS — BP 139/87 | HR 77 | Wt 158.0 lb

## 2023-07-24 DIAGNOSIS — E782 Mixed hyperlipidemia: Secondary | ICD-10-CM | POA: Diagnosis not present

## 2023-07-24 DIAGNOSIS — R5383 Other fatigue: Secondary | ICD-10-CM | POA: Diagnosis not present

## 2023-07-24 DIAGNOSIS — N939 Abnormal uterine and vaginal bleeding, unspecified: Secondary | ICD-10-CM

## 2023-07-24 DIAGNOSIS — R7303 Prediabetes: Secondary | ICD-10-CM | POA: Diagnosis not present

## 2023-07-24 MED ORDER — FLUTICASONE-SALMETEROL 250-50 MCG/ACT IN AEPB: 1.0000 | INHALATION_SPRAY | Freq: Two times a day (BID) | RESPIRATORY_TRACT | 2 refills | Status: DC

## 2023-07-24 NOTE — Progress Notes (Signed)
 Established Patient Office Visit  Subjective:  Patient ID: Kristin Shepherd, female    DOB: Apr 04, 1976  Age: 48 y.o. MRN: [id number]  Chief Complaint  Patient presents with   Follow-up    3 month follow    Patient is here today for her 3 months follow up.  She has been feeling fairly well since last appointment.   She does have additional concerns to discuss today.  She has been having some abnormal uterine bleeding, says that she has very heavy, intermittent bleeding that does not necessarily come when her period should.   Labs were done previously, so we will review these in detail today. She needs refills.   I have reviewed her active problem list, medication list, allergies, notes from last encounter, lab results for her appointment today.    No other concerns at this time.   Past Medical History:  Diagnosis Date   Asthma    Cervical inflammation    Upper respiratory tract infection 08/23/2022    Past Surgical History:  Procedure Laterality Date   APPENDECTOMY     BREAST BIOPSY Left    neg    Social History   Socioeconomic History   Marital status: Married    Spouse name: Not on file   Number of children: Not on file   Years of education: Not on file   Highest education level: Not on file  Occupational History   Not on file  Tobacco Use   Smoking status: Never   Smokeless tobacco: Never  Vaping Use   Vaping status: Never Used  Substance and Sexual Activity   Alcohol use: No   Drug use: No   Sexual activity: Not Currently    Birth control/protection: None    Comment: Mirena   Other Topics Concern   Not on file  Social History Narrative   Not on file   Social Drivers of Health   Financial Resource Strain: Not on file  Food Insecurity: Not on file  Transportation Needs: Not on file  Physical Activity: Not on file  Stress: Not on file  Social Connections: Not on file  Intimate Partner Violence: Not on file    Family History  Problem Relation Age  of Onset   Breast cancer Neg Hx     No Known Allergies  Review of Systems  All other systems reviewed and are negative.      Objective:   BP 139/87   Pulse 77   Wt 158 lb (71.7 kg)   SpO2 98%   BMI 27.12 kg/m   Vitals:   07/24/23 0911  BP: 139/87  Pulse: 77  Weight: 158 lb (71.7 kg)  SpO2: 98%    Physical Exam Vitals and nursing note reviewed.  Constitutional:      Appearance: Normal appearance. She is normal weight.  HENT:     Head: Normocephalic.  Eyes:     Extraocular Movements: Extraocular movements intact.     Conjunctiva/sclera: Conjunctivae normal.     Pupils: Pupils are equal, round, and reactive to light.  Cardiovascular:     Rate and Rhythm: Normal rate.  Pulmonary:     Effort: Pulmonary effort is normal.  Musculoskeletal:        General: Normal range of motion.  Neurological:     General: No focal deficit present.     Mental Status: She is alert and oriented to person, place, and time. Mental status is at baseline.  Psychiatric:  Mood and Affect: Mood normal.        Behavior: Behavior normal.        Thought Content: Thought content normal.        Judgment: Judgment normal.      No results found for any visits on 07/24/23.  Recent Results (from the past 2160 hours)  Hemoglobin A1c     Status: Abnormal   Collection Time: 07/18/23  9:38 AM  Result Value Ref Range   Hgb A1c MFr Bld 6.2 (H) 4.8 - 5.6 %    Comment:          Prediabetes: 5.7 - 6.4          Diabetes: >6.4          Glycemic control for adults with diabetes: <7.0    Est. average glucose Bld gHb Est-mCnc 131 mg/dL  TSH     Status: None   Collection Time: 07/18/23  9:38 AM  Result Value Ref Range   TSH 2.790 0.450 - 4.500 uIU/mL  CMP14+EGFR     Status: Abnormal   Collection Time: 07/18/23  9:38 AM  Result Value Ref Range   Glucose 110 (H) 70 - 99 mg/dL   BUN 14 6 - 24 mg/dL   Creatinine, Ser 4.09 0.57 - 1.00 mg/dL   eGFR 811 >91 YN/WGN/5.62   BUN/Creatinine Ratio 20  9 - 23   Sodium 142 134 - 144 mmol/L   Potassium 4.1 3.5 - 5.2 mmol/L   Chloride 103 96 - 106 mmol/L   CO2 23 20 - 29 mmol/L   Calcium  9.2 8.7 - 10.2 mg/dL   Total Protein 6.8 6.0 - 8.5 g/dL   Albumin 4.5 3.9 - 4.9 g/dL   Globulin, Total 2.3 1.5 - 4.5 g/dL   Bilirubin Total 0.7 0.0 - 1.2 mg/dL   Alkaline Phosphatase 72 44 - 121 IU/L   AST 21 0 - 40 IU/L   ALT 18 0 - 32 IU/L  Lipid panel     Status: Abnormal   Collection Time: 07/18/23  9:38 AM  Result Value Ref Range   Cholesterol, Total 194 100 - 199 mg/dL   Triglycerides 130 0 - 149 mg/dL   HDL 38 (L) >86 mg/dL   VLDL Cholesterol Cal 26 5 - 40 mg/dL   LDL Chol Calc (NIH) 578 (H) 0 - 99 mg/dL   Chol/HDL Ratio 5.1 (H) 0.0 - 4.4 ratio    Comment:                                   T. Chol/HDL Ratio                                             Men  Women                               1/2 Avg.Risk  3.4    3.3                                   Avg.Risk  5.0    4.4  2X Avg.Risk  9.6    7.1                                3X Avg.Risk 23.4   11.0   Vitamin D  (25 hydroxy)     Status: Abnormal   Collection Time: 07/18/23  9:38 AM  Result Value Ref Range   Vit D, 25-Hydroxy 22.4 (L) 30.0 - 100.0 ng/mL    Comment: Vitamin D  deficiency has been defined by the Institute of Medicine and an Endocrine Society practice guideline as a level of serum 25-OH vitamin D  less than 20 ng/mL (1,2). The Endocrine Society went on to further define vitamin D  insufficiency as a level between 21 and 29 ng/mL (2). 1. IOM (Institute of Medicine). 2010. Dietary reference    intakes for calcium  and D. [place]: The    [employer]. 2. Holick MF, Binkley [place], Bischoff-Ferrari HA, et al.    Evaluation, treatment, and prevention of vitamin D     deficiency: an Endocrine Society clinical practice    guideline. JCEM. 2011 Jul; 96(7):1911-30.   Vitamin B12     Status: None   Collection Time: 07/18/23  9:38 AM  Result  Value Ref Range   Vitamin B-12 483 232 - 1,245 pg/mL       Assessment & Plan:   Problem List Items Addressed This Visit       Other   Other fatigue   Patient stable.  Well controlled with current therapy.   Continue current meds.        Prediabetes   Patient educated on foods that contain carbohydrates and the need to decrease intake.  We discussed prediabetes, and what it means and the need for strict dietary control to prevent progression to type 2 diabetes.  Advised to decrease intake of sugary drinks, including sodas, sweet tea, and some juices, and of starch and sugar heavy foods (ie., potatoes, rice, bread, pasta, desserts). She verbalizes understanding and agreement with the changes discussed today.  A1C Continues to be in prediabetic ranges.  Will reassess at follow up after next lab check.  Patient counseled on dietary choices and verbalized understanding.        Mixed hyperlipidemia   Checking labs today.  Continue current therapy for lipid control. Will modify as needed based on labwork results.        Other Visit Diagnoses       Abnormal uterine bleeding    -  Primary   Sending referral to OB/GYN for patient.  Will defer to them for further treatment.   Relevant Orders   Ambulatory referral to Obstetrics / Gynecology       Return in about 3 months (around 10/24/2023) for F/U.   Total time spent: 20 minutes  Trenda Frisk, FNP  07/24/2023   This document may have been prepared by [facility] Voice Recognition software and as such may include unintentional dictation errors.

## 2023-07-24 NOTE — Patient Instructions (Addendum)
  B? sung ngh? ho?c curcumin (cng thnh ph?n ho?t ch?t) ?? gip gi?m vim  Gi?i Henretta?u ??n bc s? s?n ph? khoa - h? s? g?i ?i?n ?? ln l?ch.

## 2023-08-13 ENCOUNTER — Ambulatory Visit: Admitting: Advanced Practice Midwife

## 2023-08-13 ENCOUNTER — Encounter: Payer: Self-pay | Admitting: Advanced Practice Midwife

## 2023-08-13 VITALS — BP 143/87 | HR 78 | Wt 158.9 lb

## 2023-08-13 DIAGNOSIS — N939 Abnormal uterine and vaginal bleeding, unspecified: Secondary | ICD-10-CM | POA: Diagnosis not present

## 2023-08-13 NOTE — Patient Instructions (Signed)
 Perimenopause: What to Know Perimenopause is the time in your life when your levels of estrogen start to go down. Estrogen is the female hormone made by your ovaries. Perimenopause can start 2-8 years before menopause. It can cause changes to your menstrual period. During this time, your ovaries may or may not make an egg. In many cases, you can still get pregnant. What are the causes? Perimenopause is a natural change in your homone levels that happens as you get older. What increases the risk? You're more likely to start perimenopause Kristin Shepherd if: You have an abnormal growth (tumor) of the pituitary gland in your brain. You have a disease that affects your ovaries. You've had certain treatments for cancer. These include: Chemotherapy. Hormone therapy. Radiation therapy on the area between your hips (pelvis). You smoke a lot or drink a lot of alcohol. Other family members have gone through menopause Kristin Shepherd. What are the signs or symptoms? Symptoms are unique to each person. You may have: Hot flashes. Irregular periods. Night sweats. Changes in how you feel about sex. You may have less of a sex drive or feel more discomfort around your sexuality. Vaginal dryness. Headaches. Mood swings. Other symptoms may include: Depression. This is when you feel sad or hopeless. Trouble sleeping. Memory problems or trouble focusing. Irritability. This means getting annoyed easily. Tiredness. Weight gain. Anxiety. This is feeling worried or nervous. You can also have trouble getting pregnant. How is this diagnosed? You may be diagnosed based on: Your medical history. An exam. Your age. Your history of menstrual periods. Your symptoms. Hormone tests. How is this treated? In some cases, no treatment is needed. Talk with your health care provider about if you should get treated. Treatments may include: Menopausal hormone therapy (MHT). Medicines to treat certain  symptoms. Acupuncture. Vitamin or herbal supplements. Before you start treatment, let your provider know if you or anyone in your family has or has had: Heart disease. Breast cancer. Blood clots. Diabetes. Osteoporosis. Follow these instructions at home: Eating and drinking  Eat a balanced diet. It should include: Fresh fruits and vegetables. Whole grains. Soybeans. Eggs. Lean meat. Low-fat dairy. To help prevent hot flashes, stay away from: Alcohol. Drinks with caffeine in them. Spicy foods. Lifestyle Do not smoke, vape, or use nicotine or tobacco. Get at least 30 minutes of physical activity on 5 or more days each week. Get 7-8 hours of sleep each night. Dress in layers that can be taken off if you have a hot flash. Find ways to manage stress. You may want to try: Deep breathing. Meditation. Writing in a journal. General instructions  Take your medicines only as told. Keep track of your periods. Track: When they happen. How heavy they are. How long they last. How much time passes between periods. Keep track of your symptoms. Track: When they start. How often you have them. How long they last. Use vaginal lubricants or moisturizers. These can help with: Vaginal dryness. Comfort during sex. You can still get pregnant if you're having any periods. Make sure you use birth control if you don't want to get pregnant. Contact a health care provider if: You have a very heavy period or pass blood clots. Your period lasts more than 2 days longer than normal. Your period comes back sooner than 21 days. You bleed after having sex. You have pain during sex. You have pain when you pee. You get very bad headaches. You have trouble with your eyesight. Get help right away if: You  have chest pain. You have trouble breathing. You have trouble talking. You have very bad depression. This information is not intended to replace advice given to you by your health care provider.  Make sure you discuss any questions you have with your health care provider. Document Revised: 12/27/2022 Document Reviewed: 12/27/2022 Elsevier Patient Education  2024 [employer].

## 2023-08-13 NOTE — Progress Notes (Signed)
 Patient ID: Kristin Shepherd, female   DOB: 30-Jan-1976, 48 y.o.   MRN: [id number]  Reason for Consult: abnormal uterine bleeding   Referred by Miki Kins, FNP  Subjective:  [place] interpreter present during visit  HPI:  Kristin Shepherd is a 48 y.o. female being seen per her PCP for evaluation of abnormal uterine bleeding. Patient is premenopausal. She had Mirena IUD removed on 04/10/24. She is not sexually active (her husband is deceased) and no longer wanted the IUD. PAP smear done at the same visit and was normal. She took Kariva OCP for 2 months after IUD was removed and discontinued due to hot flashes. For the past 6-7 months she has had monthly periods that last 3-4 days heavy which then progresses to spotting for 20 days. She has approximately 1 week of no bleeding during the month. She wears a panty liner daily that she changes 2 or 3 times. Has only small spots of blood. She is here for pelvic exam to make sure there is no pathology. We discussed likely perimenopausal etiology. She requests pelvic imaging and declines hormonal control of her cycles.   She reports history of cervical polyps that were "burned off" in [place].  Past Medical History:  Diagnosis Date   Asthma    Cervical inflammation    Upper respiratory tract infection 08/23/2022   Family History  Problem Relation Age of Onset   Breast cancer Neg Hx    Past Surgical History:  Procedure Laterality Date   APPENDECTOMY     BREAST BIOPSY Left    neg    Short Social History:  Social History   Tobacco Use   Smoking status: Never   Smokeless tobacco: Never  Substance Use Topics   Alcohol use: No    No Known Allergies  Current Outpatient Medications  Medication Sig Dispense Refill   fluticasone-salmeterol (WIXELA INHUB) 250-50 MCG/ACT AEPB Inhale 1 puff into the lungs in the morning and at bedtime. 3 each 2   montelukast (SINGULAIR) 10 MG tablet Take 1 tablet (10 mg total) by mouth every morning. 90 tablet  3   rosuvastatin (CRESTOR) 20 MG tablet Take 1 tablet (20 mg total) by mouth at bedtime. 90 tablet 3   benzonatate (TESSALON PERLES) 100 MG capsule Take 1 capsule (100 mg total) by mouth 3 (three) times daily as needed for cough. (Patient not taking: Reported on 04/24/2023) 30 capsule 1   cefdinir (OMNICEF) 300 MG capsule Take 1 capsule (300 mg total) by mouth 2 (two) times daily. (Patient not taking: Reported on 04/24/2023) 14 capsule 0   cloNIDine (CATAPRES) 0.1 MG tablet Take 1 tablet (0.1 mg total) by mouth daily. (Patient not taking: Reported on 04/24/2023) 30 tablet 11   desogestrel-ethinyl estradiol (KARIVA) 0.15-0.02/0.01 MG (21/5) tablet Take 1 tablet by mouth daily. (Patient not taking: Reported on 04/24/2023) 28 tablet 11   fluticasone (FLONASE) 50 MCG/ACT nasal spray instill 1 spray into each nostril daily as directed for 2 weeks (Patient not taking: Reported on 04/24/2023)  0   levofloxacin (LEVAQUIN) 750 MG tablet Take 1 tablet (750 mg total) by mouth daily. (Patient not taking: Reported on 04/24/2023) 7 tablet 0   omeprazole (PRILOSEC) 40 MG capsule  (Patient not taking: Reported on 04/24/2023)  0   RA ALLERGY RELIEF 180 MG tablet take 1 tablet by mouth every morning for POST NASAL DRIP for 2 weeks (Patient not taking: Reported on 04/24/2023)  0   No current facility-administered medications for this  visit.    Review of Systems  Constitutional:  Negative for chills and fever.  HENT:  Negative for congestion, ear discharge, ear pain, hearing loss, sinus pain and sore throat.   Eyes:  Negative for blurred vision and double vision.  Respiratory:  Negative for cough, shortness of breath and wheezing.   Cardiovascular:  Negative for chest pain, palpitations and leg swelling.  Gastrointestinal:  Negative for abdominal pain, blood in stool, constipation, diarrhea, heartburn, melena, nausea and vomiting.  Genitourinary:  Negative for dysuria, flank pain, frequency, hematuria and urgency.   Musculoskeletal:  Negative for back pain, joint pain and myalgias.  Skin:  Negative for itching and rash.  Neurological:  Negative for dizziness, tingling, tremors, sensory change, speech change, focal weakness, seizures, loss of consciousness, weakness and headaches.  Endo/Heme/Allergies:  Negative for environmental allergies. Does not bruise/bleed easily.       Abnormal uterine bleeding  Psychiatric/Behavioral:  Negative for depression, hallucinations, memory loss, substance abuse and suicidal ideas. The patient is not nervous/anxious and does not have insomnia.         Objective:  Objective   Vitals:   08/13/23 1047  BP: (!) 143/87  Pulse: 78  Weight: 158 lb 14.4 oz (72.1 kg)   Body mass index is 27.28 kg/m. Constitutional: Well nourished, well developed female in no acute distress.  HEENT: normal Skin: Warm and dry.  Cardiovascular: Regular rate and rhythm.   Extremity:  no edema   Respiratory:  Normal respiratory effort Psych: Alert and Oriented x3. No memory deficits. Normal mood and affect.    Pelvic exam: (female chaperone present) is not limited by body habitus EGBUS: within normal limits Vagina: within normal limits and with normal mucosa, scant blood in the vault Cervix: normal appearance Adnexa: non-tender to palpation   Assessment/Plan:     48 y.o. G2 P81 female with abnormal uterine bleeding likely due to perimenopausal hormone shifts. Normal exam today.  Gyn u/s Follow up as needed (Ok to leave MyChart message)   Tresea Mall, CNM [facility] 08/13/2023 2:14 PM

## 2023-09-13 ENCOUNTER — Ambulatory Visit
Admission: RE | Admit: 2023-09-13 | Discharge: 2023-09-13 | Disposition: A | Discharge status: Home / Self Care | Source: Ambulatory Visit | Attending: Advanced Practice Midwife | Admitting: Advanced Practice Midwife

## 2023-09-13 DIAGNOSIS — N939 Abnormal uterine and vaginal bleeding, unspecified: Secondary | ICD-10-CM | POA: Diagnosis not present

## 2023-09-28 ENCOUNTER — Encounter: Payer: Self-pay | Admitting: Family

## 2023-09-28 NOTE — Assessment & Plan Note (Signed)
 Checking labs today.  Continue current therapy for lipid control. Will modify as needed based on labwork results.

## 2023-09-28 NOTE — Assessment & Plan Note (Signed)

## 2023-09-28 NOTE — Assessment & Plan Note (Signed)
 Patient stable.  Well controlled with current therapy.   Continue current meds.

## 2023-10-28 ENCOUNTER — Other Ambulatory Visit

## 2023-10-28 DIAGNOSIS — E559 Vitamin D deficiency, unspecified: Secondary | ICD-10-CM

## 2023-10-28 DIAGNOSIS — E538 Deficiency of other specified B group vitamins: Secondary | ICD-10-CM | POA: Diagnosis not present

## 2023-10-28 DIAGNOSIS — I10 Essential (primary) hypertension: Secondary | ICD-10-CM | POA: Diagnosis not present

## 2023-10-28 DIAGNOSIS — E782 Mixed hyperlipidemia: Secondary | ICD-10-CM | POA: Diagnosis not present

## 2023-10-28 DIAGNOSIS — R7303 Prediabetes: Secondary | ICD-10-CM

## 2023-10-29 ENCOUNTER — Ambulatory Visit: Payer: Self-pay

## 2023-10-29 LAB — CMP14+EGFR
ALT: 21 IU/L (ref 0–32)
AST: 22 IU/L (ref 0–40)
Albumin: 4.4 g/dL (ref 3.9–4.9)
Alkaline Phosphatase: 68 IU/L (ref 44–121)
BUN/Creatinine Ratio: 24 — ABNORMAL HIGH (ref 9–23)
BUN: 17 mg/dL (ref 6–24)
Bilirubin Total: 0.4 mg/dL (ref 0.0–1.2)
CO2: 20 mmol/L (ref 20–29)
Calcium: 9.3 mg/dL (ref 8.7–10.2)
Chloride: 104 mmol/L (ref 96–106)
Creatinine, Ser: 0.71 mg/dL (ref 0.57–1.00)
Globulin, Total: 2.4 g/dL (ref 1.5–4.5)
Glucose: 111 mg/dL — ABNORMAL HIGH (ref 70–99)
Potassium: 4.4 mmol/L (ref 3.5–5.2)
Sodium: 137 mmol/L (ref 134–144)
Total Protein: 6.8 g/dL (ref 6.0–8.5)
eGFR: 105 mL/min/{1.73_m2} (ref >59)

## 2023-10-29 LAB — VITAMIN D 25 HYDROXY (VIT D DEFICIENCY, FRACTURES): Vit D, 25-Hydroxy: 21.9 ng/mL — ABNORMAL LOW (ref 30.0–100.0)

## 2023-10-29 LAB — LIPID PANEL
Chol/HDL Ratio: 4.7 ratio — ABNORMAL HIGH (ref 0.0–4.4)
Cholesterol, Total: 215 mg/dL — ABNORMAL HIGH (ref 100–199)
HDL: 46 mg/dL (ref >39)
LDL Chol Calc (NIH): 144 mg/dL — ABNORMAL HIGH (ref 0–99)
Triglycerides: 141 mg/dL (ref 0–149)
VLDL Cholesterol Cal: 25 mg/dL (ref 5–40)

## 2023-10-29 LAB — HEMOGLOBIN A1C
Est. average glucose Bld gHb Est-mCnc: 126 mg/dL
Hgb A1c MFr Bld: 6 % — ABNORMAL HIGH (ref 4.8–5.6)

## 2023-10-29 LAB — VITAMIN B12: Vitamin B-12: 408 pg/mL (ref 232–1245)

## 2023-10-29 LAB — TSH: TSH: 4.1 u[IU]/mL (ref 0.450–4.500)

## 2023-10-30 ENCOUNTER — Encounter: Payer: Self-pay | Admitting: Family

## 2023-10-30 ENCOUNTER — Ambulatory Visit (INDEPENDENT_AMBULATORY_CARE_PROVIDER_SITE_OTHER): Admitting: Family

## 2023-10-30 VITALS — BP 130/79 | HR 81 | Ht 64.0 in | Wt 156.2 lb

## 2023-10-30 DIAGNOSIS — R7303 Prediabetes: Secondary | ICD-10-CM | POA: Diagnosis not present

## 2023-10-30 DIAGNOSIS — Z0130 Encounter for examination of blood pressure without abnormal findings: Secondary | ICD-10-CM

## 2023-10-30 DIAGNOSIS — E782 Mixed hyperlipidemia: Secondary | ICD-10-CM | POA: Diagnosis not present

## 2023-10-30 DIAGNOSIS — N926 Irregular menstruation, unspecified: Secondary | ICD-10-CM

## 2023-10-30 DIAGNOSIS — G44229 Chronic tension-type headache, not intractable: Secondary | ICD-10-CM

## 2023-10-30 MED ORDER — ROSUVASTATIN CALCIUM 40 MG PO TABS: 40.0000 mg | ORAL_TABLET | Freq: Every day | ORAL | 1 refills | Status: DC

## 2023-10-30 NOTE — Patient Instructions (Addendum)
 Ti ?ang g?i m?t gi?i Aislin?u ??n V?t l tr? li?u ?? chm c?u kh gip gi?m ?au c?/?au ??u c?a b?n.  Ti c?ng ? g?i li?u thu?c cholesterol t?ng thm c?a b?n ??n hi?u thu?c cho b?n. Vui lng cho ti bi?t n?u b?n b? ?au chn/chu?t rt nhi?u h?n v chng ta c th? quay l?i li?u tr??c ?.

## 2023-10-31 ENCOUNTER — Encounter: Payer: Self-pay | Admitting: Family

## 2023-10-31 NOTE — Progress Notes (Signed)
 Established Patient Office Visit  Subjective:  Patient ID: Shailah Gibbins, female    DOB: 10-Oct-1975  Age: 48 y.o. MRN: [id number]  Chief Complaint  Patient presents with   Follow-up    Patient is here today for her 3 months follow up.  She has been feeling fairly well since last appointment.   She does have additional concerns to discuss today.   Labs were done a few days ago, so we will review in detail today. Her cholesterol has gone up, so we will increase her cholesterol medication.  Otherwise her labs have improved.  TSH is back WNL, A1C has improved.   She does ask about some pain in her neck and headaches that are going up the back of her neck.  She is a Advertising account planner for work, so she does have a considerable amount of time that her neck is bent over her work.  She says she got a massage, which helped, but she asks if there are any other options.   She needs refills.  I have reviewed her active problem list, medication list, allergies, notes from last encounter, lab results for her appointment today.      No other concerns at this time.   Past Medical History:  Diagnosis Date   Asthma    Cervical inflammation    Upper respiratory tract infection 08/23/2022    Past Surgical History:  Procedure Laterality Date   APPENDECTOMY     BREAST BIOPSY Left    neg    Social History   Socioeconomic History   Marital status: Married    Spouse name: Not on file   Number of children: Not on file   Years of education: Not on file   Highest education level: Not on file  Occupational History   Not on file  Tobacco Use   Smoking status: Never   Smokeless tobacco: Never  Vaping Use   Vaping status: Never Used  Substance and Sexual Activity   Alcohol use: No   Drug use: No   Sexual activity: Not Currently    Birth control/protection: None    Comment: Mirena   Other Topics Concern   Not on file  Social History Narrative   Not on file   Social Drivers of Health    Financial Resource Strain: Not on file  Food Insecurity: Not on file  Transportation Needs: Not on file  Physical Activity: Not on file  Stress: Not on file  Social Connections: Not on file  Intimate Partner Violence: Not on file    Family History  Problem Relation Age of Onset   Breast cancer Neg Hx     No Known Allergies  Review of Systems  Musculoskeletal:  Positive for neck pain.  Neurological:  Positive for headaches.  All other systems reviewed and are negative.      Objective:   BP 130/79   Pulse 81   Ht 5' 4 (1.626 m)   Wt 156 lb 3.2 oz (70.9 kg)   SpO2 99%   BMI 26.81 kg/m   Vitals:   10/30/23 0844  BP: 130/79  Pulse: 81  Height: 5' 4 (1.626 m)  Weight: 156 lb 3.2 oz (70.9 kg)  SpO2: 99%  BMI (Calculated): 26.8    Physical Exam Vitals and nursing note reviewed.  Constitutional:      Appearance: Normal appearance. She is normal weight.  HENT:     Head: Normocephalic.   Eyes:     Extraocular Movements: Extraocular  movements intact.     Conjunctiva/sclera: Conjunctivae normal.     Pupils: Pupils are equal, round, and reactive to light.    Cardiovascular:     Rate and Rhythm: Normal rate and regular rhythm.  Pulmonary:     Effort: Pulmonary effort is normal.   Musculoskeletal:     Cervical back: Normal range of motion. Tenderness present.   Neurological:     General: No focal deficit present.     Mental Status: She is alert and oriented to person, place, and time. Mental status is at baseline.   Psychiatric:        Mood and Affect: Mood normal.        Behavior: Behavior normal.        Thought Content: Thought content normal.        Judgment: Judgment normal.      No results found for any visits on 10/30/23.  Recent Results (from the past 2160 hours)  Vitamin B12     Status: None   Collection Time: 10/28/23  8:46 AM  Result Value Ref Range   Vitamin B-12 408 232 - 1,245 pg/mL  Vitamin D  (25 hydroxy)     Status: Abnormal    Collection Time: 10/28/23  8:46 AM  Result Value Ref Range   Vit D, 25-Hydroxy 21.9 (L) 30.0 - 100.0 ng/mL    Comment: Vitamin D  deficiency has been defined by the Institute of Medicine and an Endocrine Society practice guideline as a level of serum 25-OH vitamin D  less than 20 ng/mL (1,2). The Endocrine Society went on to further define vitamin D  insufficiency as a level between 21 and 29 ng/mL (2). 1. IOM (Institute of Medicine). 2010. Dietary reference    intakes for calcium  and D. [place]: The    [employer]. 2. Holick MF, Binkley [place], Bischoff-Ferrari HA, et al.    Evaluation, treatment, and prevention of vitamin D     deficiency: an Endocrine Society clinical practice    guideline. JCEM. 2011 Jul; 96(7):1911-30.   Lipid panel     Status: Abnormal   Collection Time: 10/28/23  8:46 AM  Result Value Ref Range   Cholesterol, Total 215 (H) 100 - 199 mg/dL   Triglycerides 858 0 - 149 mg/dL   HDL 46 >60 mg/dL   VLDL Cholesterol Cal 25 5 - 40 mg/dL   LDL Chol Calc (NIH) 855 (H) 0 - 99 mg/dL   Chol/HDL Ratio 4.7 (H) 0.0 - 4.4 ratio    Comment:                                   T. Chol/HDL Ratio                                             Men  Women                               1/2 Avg.Risk  3.4    3.3                                   Avg.Risk  5.0    4.4  2X Avg.Risk  9.6    7.1                                3X Avg.Risk 23.4   11.0   CMP14+EGFR     Status: Abnormal   Collection Time: 10/28/23  8:46 AM  Result Value Ref Range   Glucose 111 (H) 70 - 99 mg/dL   BUN 17 6 - 24 mg/dL   Creatinine, Ser 9.28 0.57 - 1.00 mg/dL   eGFR 894 >40 fO/fpw/8.26   BUN/Creatinine Ratio 24 (H) 9 - 23   Sodium 137 134 - 144 mmol/L   Potassium 4.4 3.5 - 5.2 mmol/L   Chloride 104 96 - 106 mmol/L   CO2 20 20 - 29 mmol/L   Calcium  9.3 8.7 - 10.2 mg/dL   Total Protein 6.8 6.0 - 8.5 g/dL   Albumin 4.4 3.9 - 4.9 g/dL   Globulin, Total 2.4 1.5 - 4.5  g/dL   Bilirubin Total 0.4 0.0 - 1.2 mg/dL   Alkaline Phosphatase 68 44 - 121 IU/L   AST 22 0 - 40 IU/L   ALT 21 0 - 32 IU/L  TSH     Status: None   Collection Time: 10/28/23  8:46 AM  Result Value Ref Range   TSH 4.100 0.450 - 4.500 uIU/mL  Hemoglobin A1c     Status: Abnormal   Collection Time: 10/28/23  8:46 AM  Result Value Ref Range   Hgb A1c MFr Bld 6.0 (H) 4.8 - 5.6 %    Comment:          Prediabetes: 5.7 - 6.4          Diabetes: >6.4          Glycemic control for adults with diabetes: <7.0    Est. average glucose Bld gHb Est-mCnc 126 mg/dL       Assessment & Plan Chronic tension-type headache, not intractable Will set up PT for patient, specifically for dry needling. I suspect this will be the most helpful for her current issues. Also suggested that she use topical pain relievers to help manage symptoms.   Will reassess at follow up.  Irregular menses Explained to pt that she could be having these symptoms as a result of perimenopause.  She verbalizes understanding, and will let me know if these continue for an extended period of time.  Reassess at follow up. Mixed hyperlipidemia Increasing rosuvastatin  dose.  Patient instructed to call if she has an increase in leg cramping with the change in dose.   Will reassess at follow up to evaluate dosage change. Prediabetes A1C Continues to be in prediabetic ranges.  Will reassess at follow up after next lab check.  Patient counseled on dietary choices and verbalized understanding.     Return in about 3 months (around 01/30/2024) for F/U.   Total time spent: 20 minutes  ALAN CHRISTELLA ARRANT, FNP  10/30/2023   This document may have been prepared by [facility] Voice Recognition software and as such may include unintentional dictation errors.

## 2023-10-31 NOTE — Assessment & Plan Note (Signed)
 Increasing rosuvastatin  dose.  Patient instructed to call if she has an increase in leg cramping with the change in dose.   Will reassess at follow up to evaluate dosage change.

## 2023-10-31 NOTE — Assessment & Plan Note (Signed)
 A1C Continues to be in prediabetic ranges.  Will reassess at follow up after next lab check.  Patient counseled on dietary choices and verbalized understanding.

## 2023-11-03 ENCOUNTER — Ambulatory Visit: Payer: Self-pay | Admitting: Advanced Practice Midwife

## 2023-11-27 ENCOUNTER — Ambulatory Visit: Admitting: Family

## 2023-11-27 ENCOUNTER — Encounter: Payer: Self-pay | Admitting: Family

## 2023-11-27 VITALS — BP 119/79 | HR 57 | Ht 64.0 in | Wt 157.4 lb

## 2023-11-27 DIAGNOSIS — L509 Urticaria, unspecified: Secondary | ICD-10-CM | POA: Diagnosis not present

## 2023-11-27 MED ORDER — HYDROXYZINE HCL 10 MG PO TABS: 10.0000 mg | ORAL_TABLET | Freq: Four times a day (QID) | ORAL | 0 refills | Status: DC | PRN

## 2023-11-27 MED ORDER — PREDNISONE 20 MG PO TABS: 40.0000 mg | ORAL_TABLET | Freq: Every day | ORAL | 0 refills | Status: DC

## 2023-11-27 MED ORDER — METHYLPREDNISOLONE ACETATE 40 MG/ML IJ SUSP
40.0000 mg | Freq: Once | INTRAMUSCULAR | Status: AC
  Administered 2023-11-27: 40 mg via INTRAMUSCULAR

## 2023-11-27 NOTE — Patient Instructions (Signed)
??  n thu?c ? ???c g?i ??n hi?u thu?c cho b?n. Thu?c tr? ng?a C TH? khi?n b?n bu?n ng?, ti ? c? g?ng k cho b?n m?t lo?i thu?c t gy ra tnh tr?ng ? h?n. N?u c, b?n c th? mua th? Claritin ho?c Zyrtec (nh?ng lo?i thu?c ny khng c?n k ??n).

## 2023-11-29 LAB — ALLERGEN FOOD PROFILE SPECIFIC IGE
Allergen Apple, IgE: 0.22 kU/L — AB
Allergen Corn, IgE: 0.28 kU/L — AB
Allergen Tomato, IgE: 0.41 kU/L — AB
Chicken IgE: <0.1 kU/L
Codfish IgE: <0.1 kU/L
Egg White IgE: <0.1 kU/L
IgE (Immunoglobulin E), Serum: 18 [IU]/mL (ref 6–495)
Milk IgE: <0.1 kU/L
Orange: 0.31 kU/L — AB
Peanut IgE: 0.45 kU/L — AB
Shrimp IgE: 0.11 kU/L — AB
Soybean IgE: 0.29 kU/L — AB
Tuna: <0.1 kU/L
Wheat IgE: 0.34 kU/L — AB

## 2024-01-06 ENCOUNTER — Other Ambulatory Visit: Payer: Self-pay | Admitting: Nurse Practitioner

## 2024-01-20 ENCOUNTER — Other Ambulatory Visit: Payer: Self-pay | Admitting: Nurse Practitioner

## 2024-02-03 ENCOUNTER — Other Ambulatory Visit

## 2024-02-03 DIAGNOSIS — I10 Essential (primary) hypertension: Secondary | ICD-10-CM | POA: Diagnosis not present

## 2024-02-03 DIAGNOSIS — E559 Vitamin D deficiency, unspecified: Secondary | ICD-10-CM | POA: Diagnosis not present

## 2024-02-03 DIAGNOSIS — R7303 Prediabetes: Secondary | ICD-10-CM

## 2024-02-03 DIAGNOSIS — E782 Mixed hyperlipidemia: Secondary | ICD-10-CM

## 2024-02-03 DIAGNOSIS — E538 Deficiency of other specified B group vitamins: Secondary | ICD-10-CM | POA: Diagnosis not present

## 2024-02-04 ENCOUNTER — Ambulatory Visit: Payer: Self-pay

## 2024-02-04 LAB — CMP14+EGFR
ALT: 18 IU/L (ref 0–32)
AST: 18 IU/L (ref 0–40)
Albumin: 4.5 g/dL (ref 3.9–4.9)
Alkaline Phosphatase: 76 IU/L (ref 41–116)
BUN/Creatinine Ratio: 19 (ref 9–23)
BUN: 13 mg/dL (ref 6–24)
Bilirubin Total: 0.4 mg/dL (ref 0.0–1.2)
CO2: 24 mmol/L (ref 20–29)
Calcium: 9.2 mg/dL (ref 8.7–10.2)
Chloride: 100 mmol/L (ref 96–106)
Creatinine, Ser: 0.69 mg/dL (ref 0.57–1.00)
Globulin, Total: 2.2 g/dL (ref 1.5–4.5)
Glucose: 108 mg/dL — ABNORMAL HIGH (ref 70–99)
Potassium: 4.2 mmol/L (ref 3.5–5.2)
Sodium: 139 mmol/L (ref 134–144)
Total Protein: 6.7 g/dL (ref 6.0–8.5)
eGFR: 107 mL/min/1.73 (ref >59)

## 2024-02-04 LAB — HEMOGLOBIN A1C
Est. average glucose Bld gHb Est-mCnc: 128 mg/dL
Hgb A1c MFr Bld: 6.1 % — ABNORMAL HIGH (ref 4.8–5.6)

## 2024-02-04 LAB — VITAMIN B12: Vitamin B-12: 461 pg/mL (ref 232–1245)

## 2024-02-04 LAB — LIPID PANEL
Chol/HDL Ratio: 4.3 ratio (ref 0.0–4.4)
Cholesterol, Total: 196 mg/dL (ref 100–199)
HDL: 46 mg/dL (ref >39)
LDL Chol Calc (NIH): 131 mg/dL — ABNORMAL HIGH (ref 0–99)
Triglycerides: 107 mg/dL (ref 0–149)
VLDL Cholesterol Cal: 19 mg/dL (ref 5–40)

## 2024-02-04 LAB — TSH: TSH: 3.31 u[IU]/mL (ref 0.450–4.500)

## 2024-02-04 LAB — VITAMIN D 25 HYDROXY (VIT D DEFICIENCY, FRACTURES): Vit D, 25-Hydroxy: 23.4 ng/mL — ABNORMAL LOW (ref 30.0–100.0)

## 2024-02-05 ENCOUNTER — Ambulatory Visit: Admitting: Family

## 2024-02-05 ENCOUNTER — Encounter: Payer: Self-pay | Admitting: Family

## 2024-02-05 VITALS — BP 108/82 | HR 91 | Ht 64.0 in | Wt 156.4 lb

## 2024-02-05 DIAGNOSIS — R5383 Other fatigue: Secondary | ICD-10-CM

## 2024-02-05 DIAGNOSIS — R7303 Prediabetes: Secondary | ICD-10-CM

## 2024-02-05 DIAGNOSIS — E782 Mixed hyperlipidemia: Secondary | ICD-10-CM | POA: Diagnosis not present

## 2024-02-05 DIAGNOSIS — Z0130 Encounter for examination of blood pressure without abnormal findings: Secondary | ICD-10-CM

## 2024-02-05 DIAGNOSIS — E559 Vitamin D deficiency, unspecified: Secondary | ICD-10-CM | POA: Diagnosis not present

## 2024-02-05 DIAGNOSIS — J4520 Mild intermittent asthma, uncomplicated: Secondary | ICD-10-CM

## 2024-02-05 DIAGNOSIS — Z1231 Encounter for screening mammogram for malignant neoplasm of breast: Secondary | ICD-10-CM | POA: Insufficient documentation

## 2024-02-05 MED ORDER — CLONIDINE HCL 0.1 MG PO TABS: 0.1000 mg | ORAL_TABLET | Freq: Every day | ORAL | 11 refills | Status: AC

## 2024-02-05 MED ORDER — ROSUVASTATIN CALCIUM 40 MG PO TABS: 40.0000 mg | ORAL_TABLET | Freq: Every day | ORAL | 1 refills | Status: AC

## 2024-02-05 NOTE — Assessment & Plan Note (Addendum)
-   Reinforced healthy diet and exercise as tolerated. - Discussed labs in detail; recheck in 3 months - Continue taking medications as prescribed.

## 2024-02-05 NOTE — Progress Notes (Signed)
 Established Patient Office Visit  Subjective:  Patient ID: Kristin Shepherd, female    DOB: 1976-01-05  Age: 48 y.o. MRN: [id number]  Chief Complaint  Patient presents with   Follow-up    3 month follow up    Patient is here today for her 3 months follow up.  She has been feeling fairly well since last appointment.   She does have additional concerns to discuss today. Patient feels tired. Labs were done prior to appointment, will discuss in detail today. Her vitamin D  was low on recent lab work. Will recommend starting OTC vitamin D3-K2 supplement. Her LDL and HbgA1c is still elevated but improving. She reports working on her diet and taking her medications as prescribed. She needs refills.   I have reviewed her active problem list, medication list, allergies, family history, social history, health maintenance, notes from last encounter, lab results for her appointment today.    Patient is due for her mammogram 04/2024; will send order at next follow up. Patient wants flu shot and will return on Thursday afternoon     No other concerns at this time.   Past Medical History:  Diagnosis Date   Asthma    Cervical inflammation    Upper respiratory tract infection 08/23/2022    Past Surgical History:  Procedure Laterality Date   APPENDECTOMY     BREAST BIOPSY Left    neg    Social History   Socioeconomic History   Marital status: Married    Spouse name: Not on file   Number of children: Not on file   Years of education: Not on file   Highest education level: Not on file  Occupational History   Not on file  Tobacco Use   Smoking status: Never   Smokeless tobacco: Never  Vaping Use   Vaping status: Never Used  Substance and Sexual Activity   Alcohol use: No   Drug use: No   Sexual activity: Not Currently    Birth control/protection: None    Comment: Mirena   Other Topics Concern   Not on file  Social History Narrative   Not on file   Social Drivers of Health    Financial Resource Strain: Not on file  Food Insecurity: Not on file  Transportation Needs: Not on file  Physical Activity: Not on file  Stress: Not on file  Social Connections: Not on file  Intimate Partner Violence: Not on file    Family History  Problem Relation Age of Onset   Breast cancer Neg Hx     No Known Allergies  Review of Systems  Constitutional:  Negative for malaise/fatigue.  HENT: Negative.    Eyes:  Negative for blurred vision and pain.  Respiratory:  Negative for cough and shortness of breath.   Cardiovascular:  Negative for chest pain, palpitations, claudication and leg swelling.  Gastrointestinal:  Negative for abdominal pain, blood in stool, constipation, diarrhea, nausea and vomiting.  Genitourinary:  Negative for dysuria, frequency and urgency.  Musculoskeletal: Negative.   Skin: Negative.   Neurological:  Negative for dizziness, tingling, sensory change and headaches.  Endo/Heme/Allergies: Negative.   Psychiatric/Behavioral: Negative.         Objective:   BP 108/82   Pulse 91   Ht 5' 4 (1.626 m)   Wt 156 lb 6.4 oz (70.9 kg)   SpO2 99%   BMI 26.85 kg/m   Vitals:   02/05/24 0917  BP: 108/82  Pulse: 91  Height: 5' 4 (1.626 m)  Weight: 156 lb 6.4 oz (70.9 kg)  SpO2: 99%  BMI (Calculated): 26.83    Physical Exam Vitals and nursing note reviewed.  Constitutional:      Appearance: Normal appearance.  HENT:     Head: Normocephalic.  Eyes:     Extraocular Movements: Extraocular movements intact.     Pupils: Pupils are equal, round, and reactive to light.  Cardiovascular:     Rate and Rhythm: Normal rate and regular rhythm.     Pulses: Normal pulses.     Heart sounds: Normal heart sounds. No murmur heard. Pulmonary:     Effort: Pulmonary effort is normal. No respiratory distress.     Breath sounds: Normal breath sounds.  Abdominal:     General: There is no distension.     Tenderness: There is no abdominal tenderness.   Musculoskeletal:        General: No tenderness. Normal range of motion.     Cervical back: Normal range of motion and neck supple.     Right lower leg: No edema.     Left lower leg: No edema.  Skin:    General: Skin is warm and dry.     Coloration: Skin is not jaundiced.     Findings: No erythema.  Neurological:     General: No focal deficit present.     Mental Status: She is alert and oriented to person, place, and time.  Psychiatric:        Mood and Affect: Mood normal.        Speech: Speech normal.        Behavior: Behavior is cooperative.        Cognition and Memory: Memory is not impaired.      No results found for any visits on 02/05/24.  Recent Results (from the past 2160 hours)  Allergen food profile specific IgE [phone])     Status: Abnormal   Collection Time: 11/27/23 11:21 AM  Result Value Ref Range   Class Description Allergens Comment     Comment:     Levels of Specific IgE       Class  Description of Class     ---------------------------  -----  --------------------                    < 0.10         0         Negative            0.10 -    0.31         0/I       Equivocal/Low            0.32 -    0.55         I         Low            0.56 -    1.40         II        Moderate            1.41 -    3.90         III       High            3.91 -   19.00         IV        Very High           19.01 -  100.00  V         Very High                   >100.00         VI        Very High    IgE (Immunoglobulin E), Serum 18 6 - 495 IU/mL   Egg White IgE <0.10 Class 0 kU/L   Milk IgE <0.10 Class 0 kU/L   Codfish IgE <0.10 Class 0 kU/L   Wheat IgE 0.34 (A) Class I kU/L   Allergen Corn, IgE 0.28 (A) Class 0/I kU/L   Peanut IgE 0.45 (A) Class I kU/L   Soybean IgE 0.29 (A) Class 0/I kU/L   Shrimp IgE 0.11 (A) Class 0/I kU/L   Allergen Tomato, IgE 0.41 (A) Class I kU/L   Orange 0.31 (A) Class 0/I kU/L   Tuna <0.10 Class 0 kU/L   Allergen Apple, IgE 0.22 (A) Class  0/I kU/L   Chicken IgE <0.10 Class 0 kU/L  Hemoglobin A1c     Status: Abnormal   Collection Time: 02/03/24  9:29 AM  Result Value Ref Range   Hgb A1c MFr Bld 6.1 (H) 4.8 - 5.6 %    Comment:          Prediabetes: 5.7 - 6.4          Diabetes: >6.4          Glycemic control for adults with diabetes: <7.0    Est. average glucose Bld gHb Est-mCnc 128 mg/dL  TSH     Status: None   Collection Time: 02/03/24  9:29 AM  Result Value Ref Range   TSH 3.310 0.450 - 4.500 uIU/mL  CMP14+EGFR     Status: Abnormal   Collection Time: 02/03/24  9:29 AM  Result Value Ref Range   Glucose 108 (H) 70 - 99 mg/dL   BUN 13 6 - 24 mg/dL   Creatinine, Ser 9.30 0.57 - 1.00 mg/dL   eGFR 892 >40 fO/fpw/8.26   BUN/Creatinine Ratio 19 9 - 23   Sodium 139 134 - 144 mmol/L   Potassium 4.2 3.5 - 5.2 mmol/L   Chloride 100 96 - 106 mmol/L   CO2 24 20 - 29 mmol/L   Calcium  9.2 8.7 - 10.2 mg/dL   Total Protein 6.7 6.0 - 8.5 g/dL   Albumin 4.5 3.9 - 4.9 g/dL   Globulin, Total 2.2 1.5 - 4.5 g/dL   Bilirubin Total 0.4 0.0 - 1.2 mg/dL   Alkaline Phosphatase 76 41 - 116 IU/L   AST 18 0 - 40 IU/L   ALT 18 0 - 32 IU/L  Lipid panel     Status: Abnormal   Collection Time: 02/03/24  9:29 AM  Result Value Ref Range   Cholesterol, Total 196 100 - 199 mg/dL   Triglycerides 892 0 - 149 mg/dL   HDL 46 >60 mg/dL   VLDL Cholesterol Cal 19 5 - 40 mg/dL   LDL Chol Calc (NIH) 868 (H) 0 - 99 mg/dL   Chol/HDL Ratio 4.3 0.0 - 4.4 ratio    Comment:                                   T. Chol/HDL Ratio  Men  Women                               1/2 Avg.Risk  3.4    3.3                                   Avg.Risk  5.0    4.4                                2X Avg.Risk  9.6    7.1                                3X Avg.Risk 23.4   11.0   Vitamin D  (25 hydroxy)     Status: Abnormal   Collection Time: 02/03/24  9:29 AM  Result Value Ref Range   Vit D, 25-Hydroxy 23.4 (L) 30.0 - 100.0 ng/mL     Comment: Vitamin D  deficiency has been defined by the Institute of Medicine and an Endocrine Society practice guideline as a level of serum 25-OH vitamin D  less than 20 ng/mL (1,2). The Endocrine Society went on to further define vitamin D  insufficiency as a level between 21 and 29 ng/mL (2). 1. IOM (Institute of Medicine). 2010. Dietary reference    intakes for calcium  and D. [place]: The    [employer]. 2. Holick MF, Binkley F[place], Bischoff-Ferrari HA, et al.    Evaluation, treatment, and prevention of vitamin D     deficiency: an Endocrine Society clinical practice    guideline. JCEM. 2011 Jul; 96(7):1911-30.   Vitamin B12     Status: None   Collection Time: 02/03/24  9:29 AM  Result Value Ref Range   Vitamin B-12 461 232 - 1,245 pg/mL       Assessment & Plan Screening mammogram for breast cancer - due 04/2024 will order referral at next FU.  Mild intermittent asthma without complication - continue medications as prescribed. - avoid triggers   Other fatigue Vitamin D  deficiency, unspecified - Recommend vitamin D3-K2 OTC supplementation. -Reviewed labs in detail; recheck in 3 months  Mixed hyperlipidemia Prediabetes - Reinforced healthy diet and exercise as tolerated. - Discussed labs in detail; recheck in 3 months - Continue taking medications as prescribed.    Return in about 3 months (around 05/07/2024).   Total time spent: 25 minutes  Oddis DELENA Cain, FNP  02/05/2024   This document may have been prepared by [facility] Voice Recognition software and as such may include unintentional dictation errors.

## 2024-02-05 NOTE — Patient Instructions (Addendum)
 Mua thm th?c ph?m b? sung Vitamin D  khng k ??n (xem b?n in).  Chng ti s? ??t l?ch ch?p nh? ?nh cho b?n trong l?n h?n ti?p theo.  Ti ? n?p l?i thu?c Clonidine  cho b?n, v h? ? g?i nh?m thu?c cho bc s? khc.

## 2024-02-05 NOTE — Assessment & Plan Note (Addendum)
-   continue medications as prescribed. - avoid triggers

## 2024-02-05 NOTE — Assessment & Plan Note (Addendum)
-   Recommend vitamin D3-K2 OTC supplementation. -Reviewed labs in detail; recheck in 3 months

## 2024-02-05 NOTE — Assessment & Plan Note (Addendum)
-   due 04/2024 will order referral at next FU.

## 2024-03-24 ENCOUNTER — Other Ambulatory Visit: Payer: Self-pay | Admitting: Family

## 2024-03-24 DIAGNOSIS — Z1231 Encounter for screening mammogram for malignant neoplasm of breast: Secondary | ICD-10-CM

## 2024-05-06 ENCOUNTER — Ambulatory Visit
Admission: RE | Admit: 2024-05-06 | Discharge: 2024-05-06 | Disposition: A | Discharge status: Home / Self Care | Source: Ambulatory Visit | Attending: Family | Admitting: Family

## 2024-05-06 DIAGNOSIS — Z1231 Encounter for screening mammogram for malignant neoplasm of breast: Secondary | ICD-10-CM | POA: Insufficient documentation

## 2024-05-09 ENCOUNTER — Other Ambulatory Visit: Payer: Self-pay | Admitting: Family

## 2024-05-11 ENCOUNTER — Other Ambulatory Visit

## 2024-05-11 DIAGNOSIS — E782 Mixed hyperlipidemia: Secondary | ICD-10-CM

## 2024-05-11 DIAGNOSIS — I10 Essential (primary) hypertension: Secondary | ICD-10-CM

## 2024-05-11 DIAGNOSIS — E559 Vitamin D deficiency, unspecified: Secondary | ICD-10-CM

## 2024-05-11 DIAGNOSIS — E538 Deficiency of other specified B group vitamins: Secondary | ICD-10-CM

## 2024-05-11 DIAGNOSIS — R7303 Prediabetes: Secondary | ICD-10-CM

## 2024-05-12 LAB — LIPID PANEL
Chol/HDL Ratio: 2.8 ratio (ref 0.0–4.4)
Cholesterol, Total: 141 mg/dL (ref 100–199)
HDL: 50 mg/dL
LDL Chol Calc (NIH): 71 mg/dL (ref 0–99)
Triglycerides: 109 mg/dL (ref 0–149)
VLDL Cholesterol Cal: 20 mg/dL (ref 5–40)

## 2024-05-12 LAB — CMP14+EGFR
ALT: 24 IU/L (ref 0–32)
AST: 20 IU/L (ref 0–40)
Albumin: 4.4 g/dL (ref 3.9–4.9)
Alkaline Phosphatase: 68 IU/L (ref 41–116)
BUN/Creatinine Ratio: 19 (ref 9–23)
BUN: 14 mg/dL (ref 6–24)
Bilirubin Total: 0.4 mg/dL (ref 0.0–1.2)
CO2: 24 mmol/L (ref 20–29)
Calcium: 9.1 mg/dL (ref 8.7–10.2)
Chloride: 102 mmol/L (ref 96–106)
Creatinine, Ser: 0.72 mg/dL (ref 0.57–1.00)
Globulin, Total: 2.3 g/dL (ref 1.5–4.5)
Glucose: 127 mg/dL — ABNORMAL HIGH (ref 70–99)
Potassium: 4.1 mmol/L (ref 3.5–5.2)
Sodium: 138 mmol/L (ref 134–144)
Total Protein: 6.7 g/dL (ref 6.0–8.5)
eGFR: 102 mL/min/1.73

## 2024-05-12 LAB — VITAMIN B12: Vitamin B-12: 514 pg/mL (ref 232–1245)

## 2024-05-12 LAB — VITAMIN D 25 HYDROXY (VIT D DEFICIENCY, FRACTURES): Vit D, 25-Hydroxy: 27.6 ng/mL — ABNORMAL LOW (ref 30.0–100.0)

## 2024-05-12 LAB — TSH: TSH: 5.18 u[IU]/mL — ABNORMAL HIGH (ref 0.450–4.500)

## 2024-05-12 LAB — HEMOGLOBIN A1C
Est. average glucose Bld gHb Est-mCnc: 134 mg/dL
Hgb A1c MFr Bld: 6.3 % — ABNORMAL HIGH (ref 4.8–5.6)

## 2024-05-13 ENCOUNTER — Encounter: Payer: Self-pay | Admitting: Family

## 2024-05-13 ENCOUNTER — Ambulatory Visit: Admitting: Family

## 2024-05-13 VITALS — BP 108/78 | HR 85 | Ht 64.0 in | Wt 161.4 lb

## 2024-05-13 DIAGNOSIS — E559 Vitamin D deficiency, unspecified: Secondary | ICD-10-CM

## 2024-05-13 DIAGNOSIS — R7303 Prediabetes: Secondary | ICD-10-CM

## 2024-05-13 DIAGNOSIS — Z0130 Encounter for examination of blood pressure without abnormal findings: Secondary | ICD-10-CM

## 2024-05-13 DIAGNOSIS — E782 Mixed hyperlipidemia: Secondary | ICD-10-CM

## 2024-05-13 MED ORDER — VITAMIN D (ERGOCALCIFEROL) 1.25 MG (50000 UNIT) PO CAPS: 50000.0000 [IU] | ORAL_CAPSULE | ORAL | 1 refills | Status: AC

## 2024-05-13 NOTE — Progress Notes (Signed)
 "  Established Patient Office Visit  Subjective:  Patient ID: Kristin Shepherd, female    DOB: [birth year]  Age: 49 y.o. MRN: [id number]  Chief Complaint  Patient presents with   Follow-up    3 month follow up    Patient is here today for her 3 months follow up.  She has been feeling well since last appointment.   She does not have additional concerns to discuss today.  Labs were done prior to appointment, will discuss in detail today. Will switch to prescription vitamin D  as patient has been taking OTC vitamin D  supplementation and levels still remain low. She does not need refills.   I have reviewed her active problem list, medication list, allergies, family history, social history, health maintenance, notes from last encounter, notes from last 2 encounters, lab results for her appointment today.      No other concerns at this time.   Past Medical History:  Diagnosis Date   Asthma    Cervical inflammation    Upper respiratory tract infection 08/23/2022    Past Surgical History:  Procedure Laterality Date   APPENDECTOMY     BREAST BIOPSY Left    neg    Social History   Socioeconomic History   Marital status: Married    Spouse name: Not on file   Number of children: Not on file   Years of education: Not on file   Highest education level: Not on file  Occupational History   Not on file  Tobacco Use   Smoking status: Never   Smokeless tobacco: Never  Vaping Use   Vaping status: Never Used  Substance and Sexual Activity   Alcohol use: No   Drug use: No   Sexual activity: Not Currently    Birth control/protection: None    Comment: Mirena   Other Topics Concern   Not on file  Social History Narrative   Not on file   Social Drivers of Health   Tobacco Use: Low Risk (05/13/2024)   Patient History    Smoking Tobacco Use: Never    Smokeless Tobacco Use: Never    Passive Exposure: Not on file  Financial Resource Strain: Not on file  Food Insecurity: Not on file   Transportation Needs: Not on file  Physical Activity: Not on file  Stress: Not on file  Social Connections: Not on file  Intimate Partner Violence: Not on file  Depression (EYV7-0): Not on file  Alcohol Screen: Not on file  Housing: Not on file  Utilities: Not on file  Health Literacy: Not on file    Family History  Problem Relation Age of Onset   Breast cancer Neg Hx     Allergies[1]  Review of Systems  Constitutional:  Negative for malaise/fatigue.  HENT: Negative.    Eyes:  Negative for blurred vision and pain.  Respiratory:  Negative for cough and shortness of breath.   Cardiovascular:  Negative for chest pain, palpitations, claudication and leg swelling.  Gastrointestinal:  Negative for abdominal pain, blood in stool, constipation, diarrhea, nausea and vomiting.  Genitourinary:  Negative for dysuria, frequency and urgency.  Musculoskeletal: Negative.   Skin: Negative.   Neurological:  Negative for dizziness, tingling, sensory change and headaches.  Endo/Heme/Allergies: Negative.   Psychiatric/Behavioral: Negative.         Objective:   BP 108/78   Pulse 85   Ht 5' 4 (1.626 m)   Wt 161 lb 6.4 oz (73.2 kg)   LMP 04/15/2024   SpO2  99%   BMI 27.70 kg/m   Vitals:   05/13/24 0848  BP: 108/78  Pulse: 85  Height: 5' 4 (1.626 m)  Weight: 161 lb 6.4 oz (73.2 kg)  SpO2: 99%  BMI (Calculated): 27.69    Physical Exam Vitals and nursing note reviewed.  Constitutional:      Appearance: Normal appearance.  HENT:     Head: Normocephalic.  Eyes:     Extraocular Movements: Extraocular movements intact.     Pupils: Pupils are equal, round, and reactive to light.  Cardiovascular:     Rate and Rhythm: Normal rate and regular rhythm.     Pulses: Normal pulses.     Heart sounds: Normal heart sounds. No murmur heard. Pulmonary:     Effort: Pulmonary effort is normal. No respiratory distress.     Breath sounds: Normal breath sounds.  Abdominal:     General:  There is no distension.     Tenderness: There is no abdominal tenderness.  Musculoskeletal:        General: No tenderness. Normal range of motion.     Cervical back: Normal range of motion and neck supple.     Right lower leg: No edema.     Left lower leg: No edema.  Skin:    General: Skin is warm and dry.     Coloration: Skin is not jaundiced.     Findings: No erythema.  Neurological:     General: No focal deficit present.     Mental Status: She is alert and oriented to person, place, and time.  Psychiatric:        Mood and Affect: Mood normal.        Speech: Speech normal.        Behavior: Behavior is cooperative.        Cognition and Memory: Memory is not impaired.      No results found for any visits on 05/13/24.  Recent Results (from the past 2160 hours)  Vitamin B12     Status: None   Collection Time: 05/11/24  9:01 AM  Result Value Ref Range   Vitamin B-12 514 232 - 1,245 pg/mL  Vitamin D  (25 hydroxy)     Status: Abnormal   Collection Time: 05/11/24  9:01 AM  Result Value Ref Range   Vit D, 25-Hydroxy 27.6 (L) 30.0 - 100.0 ng/mL    Comment: Vitamin D  deficiency has been defined by the Institute of Medicine and an Endocrine Society practice guideline as a level of serum 25-OH vitamin D  less than 20 ng/mL (1,2). The Endocrine Society went on to further define vitamin D  insufficiency as a level between 21 and 29 ng/mL (2). 1. IOM (Institute of Medicine). 2010. Dietary reference    intakes for calcium  and D. [place]: The    [employer]. 2. Holick MF, Binkley [place], Bischoff-Ferrari HA, et al.    Evaluation, treatment, and prevention of vitamin D     deficiency: an Endocrine Society clinical practice    guideline. JCEM. 2011 Jul; 96(7):1911-30.   Lipid panel     Status: None   Collection Time: 05/11/24  9:01 AM  Result Value Ref Range   Cholesterol, Total 141 100 - 199 mg/dL   Triglycerides 890 0 - 149 mg/dL   HDL 50 >60 mg/dL   VLDL Cholesterol  Cal 20 5 - 40 mg/dL   LDL Chol Calc (NIH) 71 0 - 99 mg/dL   Chol/HDL Ratio 2.8 0.0 - 4.4 ratio  Comment:                                   T. Chol/HDL Ratio                                             Men  Women                               1/2 Avg.Risk  3.4    3.3                                   Avg.Risk  5.0    4.4                                2X Avg.Risk  9.6    7.1                                3X Avg.Risk 23.4   11.0   CMP14+EGFR     Status: Abnormal   Collection Time: 05/11/24  9:01 AM  Result Value Ref Range   Glucose 127 (H) 70 - 99 mg/dL   BUN 14 6 - 24 mg/dL   Creatinine, Ser 9.27 0.57 - 1.00 mg/dL   eGFR 897 >40 fO/fpw/8.26   BUN/Creatinine Ratio 19 9 - 23   Sodium 138 134 - 144 mmol/L   Potassium 4.1 3.5 - 5.2 mmol/L   Chloride 102 96 - 106 mmol/L   CO2 24 20 - 29 mmol/L   Calcium  9.1 8.7 - 10.2 mg/dL   Total Protein 6.7 6.0 - 8.5 g/dL   Albumin 4.4 3.9 - 4.9 g/dL   Globulin, Total 2.3 1.5 - 4.5 g/dL   Bilirubin Total 0.4 0.0 - 1.2 mg/dL   Alkaline Phosphatase 68 41 - 116 IU/L   AST 20 0 - 40 IU/L   ALT 24 0 - 32 IU/L  TSH     Status: Abnormal   Collection Time: 05/11/24  9:01 AM  Result Value Ref Range   TSH 5.180 (H) 0.450 - 4.500 uIU/mL  Hemoglobin A1c     Status: Abnormal   Collection Time: 05/11/24  9:01 AM  Result Value Ref Range   Hgb A1c MFr Bld 6.3 (H) 4.8 - 5.6 %    Comment:          Prediabetes: 5.7 - 6.4          Diabetes: >6.4          Glycemic control for adults with diabetes: <7.0    Est. average glucose Bld gHb Est-mCnc 134 mg/dL       Assessment & Plan:   Assessment & Plan Vitamin D  deficiency, unspecified - Switch to prescription vitamin D  once weekly and stop OTC vitamin D  supplementation. Prediabetes Mixed hyperlipidemia - Continue healthy diet and exercise as tolerated. Reinforced reduce carbohydrate and concentrated sweets as HbgA1c is trending up. - Continue medications as prescribed. - Reviewed labs today in detail  with patient.    Return in about 3 months (around 08/11/2024).  Total time spent: 20 minutes  Oddis DELENA Cain, FNP  05/13/2024   This document may have been prepared by [facility] Voice Recognition software and as such may include unintentional dictation errors.     [1] No Known Allergies  "

## 2024-05-13 NOTE — Assessment & Plan Note (Signed)
-   Continue healthy diet and exercise as tolerated. Reinforced reduce carbohydrate and concentrated sweets as HbgA1c is trending up. - Continue medications as prescribed. - Reviewed labs today in detail with patient.

## 2024-05-13 NOTE — Assessment & Plan Note (Signed)
-   Switch to prescription vitamin D  once weekly and stop OTC vitamin D  supplementation.

## 2024-05-30 ENCOUNTER — Other Ambulatory Visit: Payer: Self-pay | Admitting: Family

## 2024-08-12 ENCOUNTER — Ambulatory Visit: Admitting: Family
